# Patient Record
Sex: Female | Born: 1945 | Race: White | Hispanic: No | Smoking: Current every day smoker
Health system: Southern US, Community
[De-identification: ages and names within clinical notes are randomized; demographics above are authoritative.]

## PROBLEM LIST (undated history)

## (undated) DIAGNOSIS — J449 Chronic obstructive pulmonary disease, unspecified: Secondary | ICD-10-CM

## (undated) DIAGNOSIS — E785 Hyperlipidemia, unspecified: Secondary | ICD-10-CM

## (undated) DIAGNOSIS — N3090 Cystitis, unspecified without hematuria: Secondary | ICD-10-CM

## (undated) DIAGNOSIS — G8929 Other chronic pain: Secondary | ICD-10-CM

## (undated) DIAGNOSIS — C801 Malignant (primary) neoplasm, unspecified: Secondary | ICD-10-CM

## (undated) DIAGNOSIS — K219 Gastro-esophageal reflux disease without esophagitis: Secondary | ICD-10-CM

## (undated) DIAGNOSIS — E079 Disorder of thyroid, unspecified: Secondary | ICD-10-CM

## (undated) DIAGNOSIS — IMO0002 Reserved for concepts with insufficient information to code with codable children: Secondary | ICD-10-CM

## (undated) HISTORY — PX: EYE SURGERY: SHX253

---

## 2013-04-27 ENCOUNTER — Encounter (HOSPITAL_COMMUNITY): Payer: Self-pay | Admitting: Emergency Medicine

## 2013-04-27 ENCOUNTER — Emergency Department (HOSPITAL_COMMUNITY)
Admission: EM | Admit: 2013-04-27 | Discharge: 2013-04-27 | Disposition: A | Discharge status: Home / Self Care | Payer: Medicare Other | Attending: Emergency Medicine | Admitting: Emergency Medicine

## 2013-04-27 ENCOUNTER — Emergency Department (HOSPITAL_COMMUNITY): Payer: Medicare Other

## 2013-04-27 DIAGNOSIS — Z8501 Personal history of malignant neoplasm of esophagus: Secondary | ICD-10-CM | POA: Insufficient documentation

## 2013-04-27 DIAGNOSIS — Z7982 Long term (current) use of aspirin: Secondary | ICD-10-CM | POA: Insufficient documentation

## 2013-04-27 DIAGNOSIS — E079 Disorder of thyroid, unspecified: Secondary | ICD-10-CM | POA: Insufficient documentation

## 2013-04-27 DIAGNOSIS — Z872 Personal history of diseases of the skin and subcutaneous tissue: Secondary | ICD-10-CM | POA: Insufficient documentation

## 2013-04-27 DIAGNOSIS — M549 Dorsalgia, unspecified: Secondary | ICD-10-CM | POA: Insufficient documentation

## 2013-04-27 DIAGNOSIS — F17200 Nicotine dependence, unspecified, uncomplicated: Secondary | ICD-10-CM | POA: Insufficient documentation

## 2013-04-27 DIAGNOSIS — IMO0002 Reserved for concepts with insufficient information to code with codable children: Secondary | ICD-10-CM | POA: Insufficient documentation

## 2013-04-27 DIAGNOSIS — K219 Gastro-esophageal reflux disease without esophagitis: Secondary | ICD-10-CM | POA: Insufficient documentation

## 2013-04-27 DIAGNOSIS — J449 Chronic obstructive pulmonary disease, unspecified: Secondary | ICD-10-CM | POA: Insufficient documentation

## 2013-04-27 DIAGNOSIS — Z8742 Personal history of other diseases of the female genital tract: Secondary | ICD-10-CM | POA: Insufficient documentation

## 2013-04-27 DIAGNOSIS — E785 Hyperlipidemia, unspecified: Secondary | ICD-10-CM | POA: Insufficient documentation

## 2013-04-27 DIAGNOSIS — Z8739 Personal history of other diseases of the musculoskeletal system and connective tissue: Secondary | ICD-10-CM | POA: Insufficient documentation

## 2013-04-27 DIAGNOSIS — Z79899 Other long term (current) drug therapy: Secondary | ICD-10-CM | POA: Insufficient documentation

## 2013-04-27 DIAGNOSIS — G8929 Other chronic pain: Secondary | ICD-10-CM | POA: Insufficient documentation

## 2013-04-27 DIAGNOSIS — K5900 Constipation, unspecified: Secondary | ICD-10-CM | POA: Insufficient documentation

## 2013-04-27 DIAGNOSIS — R112 Nausea with vomiting, unspecified: Secondary | ICD-10-CM | POA: Insufficient documentation

## 2013-04-27 DIAGNOSIS — R109 Unspecified abdominal pain: Secondary | ICD-10-CM | POA: Insufficient documentation

## 2013-04-27 DIAGNOSIS — J4489 Other specified chronic obstructive pulmonary disease: Secondary | ICD-10-CM | POA: Insufficient documentation

## 2013-04-27 DIAGNOSIS — R3919 Other difficulties with micturition: Secondary | ICD-10-CM | POA: Insufficient documentation

## 2013-04-27 HISTORY — DX: Reserved for concepts with insufficient information to code with codable children: IMO0002

## 2013-04-27 HISTORY — DX: Disorder of thyroid, unspecified: E07.9

## 2013-04-27 HISTORY — DX: Cystitis, unspecified without hematuria: N30.90

## 2013-04-27 HISTORY — DX: Hyperlipidemia, unspecified: E78.5

## 2013-04-27 HISTORY — DX: Malignant (primary) neoplasm, unspecified: C80.1

## 2013-04-27 HISTORY — DX: Gastro-esophageal reflux disease without esophagitis: K21.9

## 2013-04-27 HISTORY — DX: Chronic obstructive pulmonary disease, unspecified: J44.9

## 2013-04-27 HISTORY — DX: Other chronic pain: G89.29

## 2013-04-27 LAB — CBC WITH DIFFERENTIAL/PLATELET
BASOS PCT: 1 % (ref 0–1)
Basophils Absolute: 0.1 10*3/uL (ref 0.0–0.1)
EOS ABS: 0.1 10*3/uL (ref 0.0–0.7)
EOS PCT: 1 % (ref 0–5)
HCT: 41.1 % (ref 36.0–46.0)
Hemoglobin: 14.1 g/dL (ref 12.0–15.0)
LYMPHS ABS: 1.5 10*3/uL (ref 0.7–4.0)
Lymphocytes Relative: 17 % (ref 12–46)
MCH: 33.5 pg (ref 26.0–34.0)
MCHC: 34.3 g/dL (ref 30.0–36.0)
MCV: 97.6 fL (ref 78.0–100.0)
Monocytes Absolute: 0.9 10*3/uL (ref 0.1–1.0)
Monocytes Relative: 10 % (ref 3–12)
NEUTROS PCT: 71 % (ref 43–77)
Neutro Abs: 6.1 10*3/uL (ref 1.7–7.7)
Platelets: 362 10*3/uL (ref 150–400)
RBC: 4.21 MIL/uL (ref 3.87–5.11)
RDW: 13.3 % (ref 11.5–15.5)
WBC: 8.7 10*3/uL (ref 4.0–10.5)

## 2013-04-27 LAB — URINALYSIS, ROUTINE W REFLEX MICROSCOPIC
Bilirubin Urine: NEGATIVE
Glucose, UA: NEGATIVE mg/dL
Hgb urine dipstick: NEGATIVE
Ketones, ur: 15 mg/dL — AB
LEUKOCYTES UA: NEGATIVE
NITRITE: NEGATIVE
PH: 7 (ref 5.0–8.0)
Protein, ur: NEGATIVE mg/dL
SPECIFIC GRAVITY, URINE: 1.018 (ref 1.005–1.030)
UROBILINOGEN UA: 1 mg/dL (ref 0.0–1.0)

## 2013-04-27 LAB — LACTIC ACID, PLASMA: LACTIC ACID, VENOUS: 0.9 mmol/L (ref 0.5–2.2)

## 2013-04-27 LAB — COMPREHENSIVE METABOLIC PANEL
ALBUMIN: 3.8 g/dL (ref 3.5–5.2)
ALT: 15 U/L (ref 0–35)
AST: 21 U/L (ref 0–37)
Alkaline Phosphatase: 150 U/L — ABNORMAL HIGH (ref 39–117)
BUN: 23 mg/dL (ref 6–23)
CALCIUM: 9.3 mg/dL (ref 8.4–10.5)
CO2: 25 mEq/L (ref 19–32)
Chloride: 99 mEq/L (ref 96–112)
Creatinine, Ser: 0.97 mg/dL (ref 0.50–1.10)
GFR calc Af Amer: 69 mL/min — ABNORMAL LOW (ref >90)
GFR calc non Af Amer: 59 mL/min — ABNORMAL LOW (ref >90)
GLUCOSE: 114 mg/dL — AB (ref 70–99)
Potassium: 4 mEq/L (ref 3.7–5.3)
SODIUM: 140 meq/L (ref 137–147)
TOTAL PROTEIN: 8.2 g/dL (ref 6.0–8.3)
Total Bilirubin: 0.3 mg/dL (ref 0.3–1.2)

## 2013-04-27 LAB — LIPASE, BLOOD: LIPASE: 19 U/L (ref 11–59)

## 2013-04-27 MED ORDER — IOHEXOL 300 MG/ML  SOLN
25.0000 mL | INTRAMUSCULAR | Status: AC
  Administered 2013-04-27: 25 mL via ORAL

## 2013-04-27 MED ORDER — IOHEXOL 300 MG/ML  SOLN
80.0000 mL | Freq: Once | INTRAMUSCULAR | Status: AC | PRN
Start: 2013-04-27 — End: 2013-04-27
  Administered 2013-04-27: 80 mL via INTRAVENOUS

## 2013-04-27 MED ORDER — HYDROMORPHONE HCL PF 1 MG/ML IJ SOLN
0.5000 mg | Freq: Once | INTRAMUSCULAR | Status: AC
  Administered 2013-04-27: 0.5 mg via INTRAVENOUS
  Filled 2013-04-27: qty 1

## 2013-04-27 MED ORDER — PEG 3350-KCL-NABCB-NACL-NASULF 236 G PO SOLR: 4000.0000 mL | Freq: Once | ORAL | Status: DC

## 2013-04-27 MED ORDER — MAGNESIUM CITRATE PO SOLN
1.0000 | Freq: Once | ORAL | Status: AC
Start: 2013-04-27 — End: 2013-04-27
  Administered 2013-04-27: 1 via ORAL
  Filled 2013-04-27: qty 296

## 2013-04-27 MED ORDER — MORPHINE SULFATE 4 MG/ML IJ SOLN
2.0000 mg | Freq: Once | INTRAMUSCULAR | Status: AC
  Administered 2013-04-27: 2 mg via INTRAVENOUS
  Filled 2013-04-27: qty 1

## 2013-04-27 MED ORDER — ONDANSETRON HCL 4 MG/2ML IJ SOLN
4.0000 mg | Freq: Once | INTRAMUSCULAR | Status: AC
  Administered 2013-04-27: 4 mg via INTRAVENOUS
  Filled 2013-04-27: qty 2

## 2013-04-27 NOTE — ED Notes (Signed)
Patient transported to CT 

## 2013-04-27 NOTE — ED Notes (Signed)
PA at bedside.

## 2013-04-27 NOTE — Discharge Instructions (Signed)
Abdominal Pain, Adult °Many things can cause abdominal pain. Usually, abdominal pain is not caused by a disease and will improve without treatment. It can often be observed and treated at home. Your health care provider will do a physical exam and possibly order blood tests and X-rays to help determine the seriousness of your pain. However, in many cases, more time must pass before a clear cause of the pain can be found. Before that point, your health care provider may not know if you need more testing or further treatment. °HOME CARE INSTRUCTIONS  °Monitor your abdominal pain for any changes. The following actions may help to alleviate any discomfort you are experiencing: °· Only take over-the-counter or prescription medicines as directed by your health care provider. °· Do not take laxatives unless directed to do so by your health care provider. °· Try a clear liquid diet (broth, tea, or water) as directed by your health care provider. Slowly move to a bland diet as tolerated. °SEEK MEDICAL CARE IF: °· You have unexplained abdominal pain. °· You have abdominal pain associated with nausea or diarrhea. °· You have pain when you urinate or have a bowel movement. °· You experience abdominal pain that wakes you in the night. °· You have abdominal pain that is worsened or improved by eating food. °· You have abdominal pain that is worsened with eating fatty foods. °SEEK IMMEDIATE MEDICAL CARE IF:  °· Your pain does not go away within 2 hours. °· You have a fever. °· You keep throwing up (vomiting). °· Your pain is felt only in portions of the abdomen, such as the right side or the left lower portion of the abdomen. °· You pass bloody or black tarry stools. °MAKE SURE YOU: °· Understand these instructions.   °· Will watch your condition.   °· Will get help right away if you are not doing well or get worse.   °Document Released: 11/19/2004 Document Revised: 11/30/2012 Document Reviewed: 10/19/2012 °[employer]® Patient  Information ©2014 [employer]. °Constipation, Adult °Constipation is when a person has fewer than 3 bowel movements a week; has difficulty having a bowel movement; or has stools that are dry, hard, or larger than normal. As people grow older, constipation is more common. If you try to fix constipation with medicines that make you have a bowel movement (laxatives), the problem may get worse. Long-term laxative use may cause the muscles of the colon to become weak. A low-fiber diet, not taking in enough fluids, and taking certain medicines may make constipation worse. °CAUSES  °· Certain medicines, such as antidepressants, pain medicine, iron supplements, antacids, and water pills.   °· Certain diseases, such as diabetes, irritable bowel syndrome (IBS), thyroid disease, or depression.   °· Not drinking enough water.   °· Not eating enough fiber-rich foods.   °· Stress or travel. °· Lack of physical activity or exercise. °· Not going to the restroom when there is the urge to have a bowel movement. °· Ignoring the urge to have a bowel movement. °· Using laxatives too much. °SYMPTOMS  °· Having fewer than 3 bowel movements a week.   °· Straining to have a bowel movement.   °· Having hard, dry, or larger than normal stools.   °· Feeling full or bloated.   °· Pain in the lower abdomen. °· Not feeling relief after having a bowel movement. °DIAGNOSIS  °Your caregiver will take a medical history and perform a physical exam. Further testing may be done for severe constipation. Some tests may include:  °· A barium enema   X-ray to examine your rectum, colon, and sometimes, your small intestine. °· A sigmoidoscopy to examine your lower colon. °· A colonoscopy to examine your entire colon. °TREATMENT  °Treatment will depend on the severity of your constipation and what is causing it. Some dietary treatments include drinking more fluids and eating more fiber-rich foods. Lifestyle treatments may include regular exercise. If these  diet and lifestyle recommendations do not help, your caregiver may recommend taking over-the-counter laxative medicines to help you have bowel movements. Prescription medicines may be prescribed if over-the-counter medicines do not work.  °HOME CARE INSTRUCTIONS  °· Increase dietary fiber in your diet, such as fruits, vegetables, whole grains, and beans. Limit high-fat and processed sugars in your diet, such as French fries, hamburgers, cookies, candies, and soda.   °· A fiber supplement may be added to your diet if you cannot get enough fiber from foods.   °· Drink enough fluids to keep your urine clear or pale yellow.   °· Exercise regularly or as directed by your caregiver.   °· Go to the restroom when you have the urge to go. Do not hold it. °· Only take medicines as directed by your caregiver. Do not take other medicines for constipation without talking to your caregiver first. °SEEK IMMEDIATE MEDICAL CARE IF:  °· You have bright red blood in your stool.   °· Your constipation lasts for more than 4 days or gets worse.   °· You have abdominal or rectal pain.   °· You have thin, pencil-like stools. °· You have unexplained weight loss. °MAKE SURE YOU:  °· Understand these instructions. °· Will watch your condition. °· Will get help right away if you are not doing well or get worse. °Document Released: 11/08/2003 Document Revised: 05/04/2011 Document Reviewed: 11/21/2012 °[employer]® Patient Information ©2014 [employer]. ° °

## 2013-04-27 NOTE — ED Notes (Signed)
CT notified pt is ready

## 2013-04-27 NOTE — ED Provider Notes (Signed)
CT scan pending to evaluate abdominal pain, r/o obstruction. Patient states rectal exam performed yesterday by her doctor did not show impaction. Repeat exam deferred.  CT without obstruction or significant abnormality. Reviewed with Dr. Roxanne Mins. Supports constipation as source of abdominal pain. Mag Citrate given in ED, Rx for GoLytely. Discharge home to follow up with PCP tomorrow for recheck.   Dewaine Oats, PA-C 04/27/13 2212

## 2013-04-27 NOTE — ED Notes (Signed)
Per pt sts 1 week of severe constipation snd now she is vomiting. Sts nothing OTC has worked. sts also severe back pain.

## 2013-04-27 NOTE — ED Provider Notes (Signed)
CSN: [id number]     Arrival date & time 04/27/13  1651 History   First MD Initiated Contact with Patient 04/27/13 1828     Chief Complaint  Patient presents with  . Abdominal Pain  . Constipation  . Emesis    HPI  Makayla Thompson is a 68 y.o. female with a PMH of COPD, DDD, GERD, esophageal cancer, ulcer, thyroid disease, cystitis, chronic pain, and HLD who presents to the ED for evaluation of abdominal pain, constipation, and emesis. History was provided by the patient and her sister. Patient has had constant abdominal pain for the past week. Her pain is located throughout her abdomen without radiation. She describes it as an aching pain with intermittent squeezing sensation. Nothing worsens or relieves her pain. She has also been constipated with no bowel movement in the past 8-9 days. Patient has tried Miralax and stool softeners with no relief. She did have some brown anal leakage today. No rectal bleeding/pain. She also has had nausea with emesis x 3 today. Mild nausea currently. No dysuria but urine has been decreased. No chest pain, SOB, dyspnea, fever, chills, headache, dizziness, lightheadedness, or leg edema. Previous abdominal surgeries include a repair for esophageal cancer. Went to her PCP yesterday who sent her to the ED for a CT scan.    Past Medical History  Diagnosis Date  . Cancer   . Ulcer   . GERD (gastroesophageal reflux disease)   . Thyroid disease   . Cystitis   . DDD (degenerative disc disease)   . COPD (chronic obstructive pulmonary disease)   . Chronic pain   . Hyperlipidemia    History reviewed. No pertinent past surgical history. History reviewed. No pertinent family history. History  Substance Use Topics  . Smoking status: Current Every Day Smoker  . Smokeless tobacco: Not on file  . Alcohol Use: No   OB History   Grav Para Term Preterm Abortions TAB SAB Ect Mult Living                 Review of Systems  Constitutional: Negative for fever, chills,  diaphoresis, activity change, appetite change and fatigue.  HENT: Negative for congestion, rhinorrhea and sore throat.   Respiratory: Negative for cough and shortness of breath.   Cardiovascular: Negative for chest pain.  Gastrointestinal: Positive for nausea, vomiting, abdominal pain and constipation. Negative for diarrhea, blood in stool, abdominal distention, anal bleeding and rectal pain.  Genitourinary: Positive for decreased urine volume. Negative for dysuria, hematuria, flank pain, vaginal bleeding, vaginal discharge, difficulty urinating and vaginal pain.  Musculoskeletal: Positive for back pain (chronic). Negative for myalgias and neck pain.  Neurological: Negative for dizziness, syncope, weakness, light-headedness and headaches.     Allergies  Amitriptyline; Codeine; Eggs or egg-derived products; Metoprolol; Sulfa antibiotics; and Verapamil  Home Medications   Current Outpatient Rx  Name  Route  Sig  Dispense  Refill  . aspirin 81 MG tablet   Oral   Take 81 mg by mouth daily.         . cyclobenzaprine (FLEXERIL) 10 MG tablet   Oral   Take 10 mg by mouth daily as needed for muscle spasms.         Marland Kitchen esomeprazole (NEXIUM) 40 MG capsule   Oral   Take 40 mg by mouth daily at 12 noon.         . fluticasone (FLONASE) 50 MCG/ACT nasal spray   Each Nare   Place 1 spray into both nostrils  daily.         . levothyroxine (SYNTHROID, LEVOTHROID) 100 MCG tablet   Oral   Take 100 mcg by mouth daily before breakfast.         . ondansetron (ZOFRAN) 8 MG tablet   Oral   Take 8 mg by mouth every 8 (eight) hours as needed for nausea or vomiting.         Marland Kitchen oxyCODONE-acetaminophen (PERCOCET/ROXICET) 5-325 MG per tablet   Oral   Take 1 tablet by mouth every 4 (four) hours as needed for severe pain.         . polyethylene glycol (MIRALAX / GLYCOLAX) packet   Oral   Take 17 g by mouth daily.         . pravastatin (PRAVACHOL) 40 MG tablet   Oral   Take 40 mg by  mouth daily.         Marland Kitchen venlafaxine XR (EFFEXOR-XR) 75 MG 24 hr capsule   Oral   Take 75 mg by mouth daily with breakfast.          BP 122/69  Pulse 107  Temp(Src) 98.3 F (36.8 C)  Resp 18  Wt 128 lb (58.06 kg)  SpO2 97%  Filed Vitals:   04/27/13 1838 04/27/13 1900 04/27/13 1919 04/27/13 1930  BP: 123/78 114/75 129/72 138/73  Pulse: 97 92 95 92  Temp: 98.3 F (36.8 C)     Resp:   20 18  Weight:      SpO2: 100% 98% 97% 100%    Physical Exam  Nursing note and vitals reviewed. Constitutional: She is oriented to person, place, and time. She appears well-developed and well-nourished. No distress.  HENT:  Head: Normocephalic and atraumatic.  Right Ear: External ear normal.  Left Ear: External ear normal.  Nose: Nose normal.  Mouth/Throat: Oropharynx is clear and moist.  Eyes: Conjunctivae are normal. Right eye exhibits no discharge. Left eye exhibits no discharge.  Neck: Normal range of motion. Neck supple.  Cardiovascular: Normal rate, regular rhythm, normal heart sounds and intact distal pulses.  Exam reveals no gallop and no friction rub.   No murmur heard. Pulmonary/Chest: Effort normal and breath sounds normal. No respiratory distress. She has no wheezes. She has no rales. She exhibits no tenderness.  Abdominal: Soft. Bowel sounds are normal. She exhibits no distension and no mass. There is tenderness. There is no rebound and no guarding.  Diffuse tenderness to palpation to the abdomen throughout  Musculoskeletal: Normal range of motion. She exhibits no edema and no tenderness.  No LE edema or calf tenderness bilaterally  Neurological: She is alert and oriented to person, place, and time.  Skin: Skin is warm and dry. She is not diaphoretic.    ED Course  Procedures (including critical care time) Labs Review Labs Reviewed  COMPREHENSIVE METABOLIC PANEL - Abnormal; Notable for the following:    Glucose, Bld 114 (*)    Alkaline Phosphatase 150 (*)    GFR calc  non Af Amer 59 (*)    GFR calc Af Amer 69 (*)    All other components within normal limits  CBC WITH DIFFERENTIAL  LIPASE, BLOOD  URINALYSIS, ROUTINE W REFLEX MICROSCOPIC   Imaging Review Dg Abd 1 View  04/27/2013   CLINICAL DATA:  Upper abdominal pain for 1 week.  EXAM: ABDOMEN - 1 VIEW  COMPARISON:  None.  FINDINGS: The bowel gas pattern is normal. No radio-opaque calculi or other significant radiographic abnormality are seen.  IMPRESSION: Negative.   Electronically Signed   By: Kerby Moors M.D.   On: 04/27/2013 17:50     EKG Interpretation None      Results for orders placed during the hospital encounter of 04/27/13  CBC WITH DIFFERENTIAL      Result Value Ref Range   WBC 8.7  4.0 - 10.5 K/uL   RBC 4.21  3.87 - 5.11 MIL/uL   Hemoglobin 14.1  12.0 - 15.0 g/dL   HCT 41.1  36.0 - 46.0 %   MCV 97.6  78.0 - 100.0 fL   MCH 33.5  26.0 - 34.0 pg   MCHC 34.3  30.0 - 36.0 g/dL   RDW 13.3  11.5 - 15.5 %   Platelets 362  150 - 400 K/uL   Neutrophils Relative % 71  43 - 77 %   Neutro Abs 6.1  1.7 - 7.7 K/uL   Lymphocytes Relative 17  12 - 46 %   Lymphs Abs 1.5  0.7 - 4.0 K/uL   Monocytes Relative 10  3 - 12 %   Monocytes Absolute 0.9  0.1 - 1.0 K/uL   Eosinophils Relative 1  0 - 5 %   Eosinophils Absolute 0.1  0.0 - 0.7 K/uL   Basophils Relative 1  0 - 1 %   Basophils Absolute 0.1  0.0 - 0.1 K/uL  COMPREHENSIVE METABOLIC PANEL      Result Value Ref Range   Sodium 140  137 - 147 mEq/L   Potassium 4.0  3.7 - 5.3 mEq/L   Chloride 99  96 - 112 mEq/L   CO2 25  19 - 32 mEq/L   Glucose, Bld 114 (*) 70 - 99 mg/dL   BUN 23  6 - 23 mg/dL   Creatinine, Ser 0.97  0.50 - 1.10 mg/dL   Calcium 9.3  8.4 - 10.5 mg/dL   Total Protein 8.2  6.0 - 8.3 g/dL   Albumin 3.8  3.5 - 5.2 g/dL   AST 21  0 - 37 U/L   ALT 15  0 - 35 U/L   Alkaline Phosphatase 150 (*) 39 - 117 U/L   Total Bilirubin 0.3  0.3 - 1.2 mg/dL   GFR calc non Af Amer 59 (*) >90 mL/min   GFR calc Af Amer 69 (*) >90 mL/min   LIPASE, BLOOD      Result Value Ref Range   Lipase 19  11 - 59 U/L  LACTIC ACID, PLASMA      Result Value Ref Range   Lactic Acid, Venous 0.9  0.5 - 2.2 mmol/L    DG Abd 1 View (Final result)  Result time: 04/27/13 17:50:34    Final result by Rad Results In Interface (04/27/13 17:50:34)    Narrative:   CLINICAL DATA: Upper abdominal pain for 1 week.  EXAM: ABDOMEN - 1 VIEW  COMPARISON: None.  FINDINGS: The bowel gas pattern is normal. No radio-opaque calculi or other significant radiographic abnormality are seen.  IMPRESSION: Negative.   Electronically Signed By: Kerby Moors M.D. On: 04/27/2013 17:50     MDM   Makayla Thompson is a 68 y.o. female with a PMH of COPD, DDD, GERD, esophageal cancer, ulcer, thyroid disease, cystitis, chronic pain, and HLD who presents to the ED for evaluation of abdominal pain, constipation, and emesis.   Rechecks  8:25 PM = Pain not improved after morphine. Ordering 0.5 mg dilaudid.     8:15 PM = Signed out care to [facility]  PA-C to await results of CT scan, EKG, UA, and perform rectal exam. Etiology of abdominal pain unclear. Possibly due to constipation vs obstruction. Lactic acid WNL. Labs unremarkable.    Mercy Moore PA-C   This patient was discussed with Dr. Doristine Counter, PA-C 04/27/13 2026

## 2013-05-03 NOTE — ED Provider Notes (Signed)
Medical screening examination/treatment/procedure(s) were performed by non-physician practitioner and as supervising physician I was immediately available for consultation/collaboration.   EKG Interpretation None        Orpah Greek, MD 05/03/13 1526

## 2013-05-03 NOTE — ED Provider Notes (Signed)
Medical screening examination/treatment/procedure(s) were performed by non-physician practitioner and as supervising physician I was immediately available for consultation/collaboration.   EKG Interpretation None        Agapita Savarino J. Cashay Manganelli, MD 05/03/13 1526 

## 2013-05-08 ENCOUNTER — Emergency Department (HOSPITAL_COMMUNITY): Payer: Medicare Other

## 2013-05-08 ENCOUNTER — Emergency Department (HOSPITAL_COMMUNITY)
Admission: EM | Admit: 2013-05-08 | Discharge: 2013-05-08 | Disposition: A | Discharge status: Home / Self Care | Payer: Medicare Other | Attending: Emergency Medicine | Admitting: Emergency Medicine

## 2013-05-08 ENCOUNTER — Encounter (HOSPITAL_COMMUNITY): Payer: Self-pay | Admitting: Emergency Medicine

## 2013-05-08 DIAGNOSIS — G8929 Other chronic pain: Secondary | ICD-10-CM | POA: Insufficient documentation

## 2013-05-08 DIAGNOSIS — E079 Disorder of thyroid, unspecified: Secondary | ICD-10-CM | POA: Insufficient documentation

## 2013-05-08 DIAGNOSIS — F17200 Nicotine dependence, unspecified, uncomplicated: Secondary | ICD-10-CM | POA: Insufficient documentation

## 2013-05-08 DIAGNOSIS — Z7982 Long term (current) use of aspirin: Secondary | ICD-10-CM | POA: Insufficient documentation

## 2013-05-08 DIAGNOSIS — Z79899 Other long term (current) drug therapy: Secondary | ICD-10-CM | POA: Insufficient documentation

## 2013-05-08 DIAGNOSIS — J449 Chronic obstructive pulmonary disease, unspecified: Secondary | ICD-10-CM | POA: Insufficient documentation

## 2013-05-08 DIAGNOSIS — Z872 Personal history of diseases of the skin and subcutaneous tissue: Secondary | ICD-10-CM | POA: Insufficient documentation

## 2013-05-08 DIAGNOSIS — Z8501 Personal history of malignant neoplasm of esophagus: Secondary | ICD-10-CM | POA: Insufficient documentation

## 2013-05-08 DIAGNOSIS — R109 Unspecified abdominal pain: Secondary | ICD-10-CM

## 2013-05-08 DIAGNOSIS — K219 Gastro-esophageal reflux disease without esophagitis: Secondary | ICD-10-CM | POA: Insufficient documentation

## 2013-05-08 DIAGNOSIS — IMO0002 Reserved for concepts with insufficient information to code with codable children: Secondary | ICD-10-CM | POA: Insufficient documentation

## 2013-05-08 DIAGNOSIS — J4489 Other specified chronic obstructive pulmonary disease: Secondary | ICD-10-CM | POA: Insufficient documentation

## 2013-05-08 DIAGNOSIS — E785 Hyperlipidemia, unspecified: Secondary | ICD-10-CM | POA: Insufficient documentation

## 2013-05-08 DIAGNOSIS — K5900 Constipation, unspecified: Secondary | ICD-10-CM | POA: Insufficient documentation

## 2013-05-08 LAB — URINALYSIS, ROUTINE W REFLEX MICROSCOPIC
Bilirubin Urine: NEGATIVE
Glucose, UA: NEGATIVE mg/dL
Hgb urine dipstick: NEGATIVE
KETONES UR: 15 mg/dL — AB
LEUKOCYTES UA: NEGATIVE
NITRITE: NEGATIVE
PH: 7.5 (ref 5.0–8.0)
Protein, ur: NEGATIVE mg/dL
Specific Gravity, Urine: 1.014 (ref 1.005–1.030)
Urobilinogen, UA: 1 mg/dL (ref 0.0–1.0)

## 2013-05-08 LAB — CBC WITH DIFFERENTIAL/PLATELET
BASOS ABS: 0.1 10*3/uL (ref 0.0–0.1)
Basophils Relative: 1 % (ref 0–1)
EOS PCT: 2 % (ref 0–5)
Eosinophils Absolute: 0.1 10*3/uL (ref 0.0–0.7)
HCT: 38 % (ref 36.0–46.0)
Hemoglobin: 12.7 g/dL (ref 12.0–15.0)
Lymphocytes Relative: 18 % (ref 12–46)
Lymphs Abs: 1.5 10*3/uL (ref 0.7–4.0)
MCH: 32.2 pg (ref 26.0–34.0)
MCHC: 33.4 g/dL (ref 30.0–36.0)
MCV: 96.2 fL (ref 78.0–100.0)
Monocytes Absolute: 0.8 10*3/uL (ref 0.1–1.0)
Monocytes Relative: 9 % (ref 3–12)
NEUTROS ABS: 6.1 10*3/uL (ref 1.7–7.7)
NEUTROS PCT: 71 % (ref 43–77)
Platelets: 319 10*3/uL (ref 150–400)
RBC: 3.95 MIL/uL (ref 3.87–5.11)
RDW: 13.4 % (ref 11.5–15.5)
WBC: 8.6 10*3/uL (ref 4.0–10.5)

## 2013-05-08 LAB — COMPREHENSIVE METABOLIC PANEL
ALK PHOS: 133 U/L — AB (ref 39–117)
ALT: 10 U/L (ref 0–35)
AST: 16 U/L (ref 0–37)
Albumin: 3.5 g/dL (ref 3.5–5.2)
BUN: 12 mg/dL (ref 6–23)
CO2: 23 mEq/L (ref 19–32)
Calcium: 8.9 mg/dL (ref 8.4–10.5)
Chloride: 100 mEq/L (ref 96–112)
Creatinine, Ser: 0.94 mg/dL (ref 0.50–1.10)
GFR calc non Af Amer: 61 mL/min — ABNORMAL LOW (ref >90)
GFR, EST AFRICAN AMERICAN: 71 mL/min — AB (ref >90)
GLUCOSE: 80 mg/dL (ref 70–99)
POTASSIUM: 3.8 meq/L (ref 3.7–5.3)
Sodium: 139 mEq/L (ref 137–147)
Total Bilirubin: 0.4 mg/dL (ref 0.3–1.2)
Total Protein: 7.3 g/dL (ref 6.0–8.3)

## 2013-05-08 LAB — I-STAT CG4 LACTIC ACID, ED: Lactic Acid, Venous: 0.95 mmol/L (ref 0.5–2.2)

## 2013-05-08 LAB — LIPASE, BLOOD: LIPASE: 17 U/L (ref 11–59)

## 2013-05-08 MED ORDER — SODIUM CHLORIDE 0.9 % IV BOLUS (SEPSIS)
1000.0000 mL | Freq: Once | INTRAVENOUS | Status: AC
  Administered 2013-05-08: 1000 mL via INTRAVENOUS

## 2013-05-08 MED ORDER — ONDANSETRON HCL 4 MG/2ML IJ SOLN
4.0000 mg | Freq: Once | INTRAMUSCULAR | Status: AC
  Administered 2013-05-08: 4 mg via INTRAVENOUS
  Filled 2013-05-08: qty 2

## 2013-05-08 MED ORDER — HYDROMORPHONE HCL PF 1 MG/ML IJ SOLN
0.5000 mg | Freq: Once | INTRAMUSCULAR | Status: AC
  Administered 2013-05-08: 0.5 mg via INTRAVENOUS
  Filled 2013-05-08: qty 1

## 2013-05-08 MED ORDER — MILK AND MOLASSES ENEMA
1.0000 | Freq: Once | RECTAL | Status: AC
  Administered 2013-05-08: 250 mL via RECTAL
  Filled 2013-05-08: qty 250

## 2013-05-08 MED ORDER — MORPHINE SULFATE 4 MG/ML IJ SOLN
4.0000 mg | Freq: Once | INTRAMUSCULAR | Status: AC
  Administered 2013-05-08: 4 mg via INTRAVENOUS
  Filled 2013-05-08: qty 1

## 2013-05-08 MED ORDER — MAGNESIUM CITRATE PO SOLN
1.0000 | Freq: Once | ORAL | Status: AC
  Administered 2013-05-08: 1 via ORAL
  Filled 2013-05-08: qty 296

## 2013-05-08 MED ORDER — PROMETHAZINE HCL 25 MG RE SUPP: 25.0000 mg | Freq: Four times a day (QID) | RECTAL | Status: DC | PRN

## 2013-05-08 NOTE — ED Provider Notes (Signed)
Patient signed out to me at shift change. Patient with abdominal pain for multiple weeks. Last bowel movement was one week ago. Unable to a bowel movement at home. CT obtained from 2 days ago showed constipation. Patient since then has been to the [facility] ER and now back here for persistent pain, nausea, vomiting. Patient was initially evaluated by PA Jerline Pain and Dr. Dina Rich. Patient signed out to me after she received enema and magnesium saturate, awaiting bowel movement. Patient now has had a very small bowel movement, she continues to have right lower quadrant pain, nausea. I have discussed again with patient that her lab work and x-ray today were unremarkable. Patient has an appointment with gastroenterologist tomorrow. I have strongly instructed her to followup with them, this time no indication for admission. Patient her family voiced understanding. She'll be discharged home with followup tomorrow.  Renold Genta, PA-C 05/08/13 2028

## 2013-05-08 NOTE — Discharge Instructions (Signed)
Continue phenergan and zofran for pain. Please follow up with gastroenterologist tomorrow.     Abdominal Pain, Adult Many things can cause abdominal pain. Usually, abdominal pain is not caused by a disease and will improve without treatment. It can often be observed and treated at home. Your health care provider will do a physical exam and possibly order blood tests and X-rays to help determine the seriousness of your pain. However, in many cases, more time must pass before a clear cause of the pain can be found. Before that point, your health care provider may not know if you need more testing or further treatment. HOME CARE INSTRUCTIONS  Monitor your abdominal pain for any changes. The following actions may help to alleviate any discomfort you are experiencing:  Only take over-the-counter or prescription medicines as directed by your health care provider.  Do not take laxatives unless directed to do so by your health care provider.  Try a clear liquid diet (broth, tea, or water) as directed by your health care provider. Slowly move to a bland diet as tolerated. SEEK MEDICAL CARE IF:  You have unexplained abdominal pain.  You have abdominal pain associated with nausea or diarrhea.  You have pain when you urinate or have a bowel movement.  You experience abdominal pain that wakes you in the night.  You have abdominal pain that is worsened or improved by eating food.  You have abdominal pain that is worsened with eating fatty foods. SEEK IMMEDIATE MEDICAL CARE IF:   Your pain does not go away within 2 hours.  You have a fever.  You keep throwing up (vomiting).  Your pain is felt only in portions of the abdomen, such as the right side or the left lower portion of the abdomen.  You pass bloody or black tarry stools. MAKE SURE YOU:  Understand these instructions.   Will watch your condition.   Will get help right away if you are not doing well or get worse.  Document  Released: 11/19/2004 Document Revised: 11/30/2012 Document Reviewed: 10/19/2012 [facility] Patient Information 2014 [place].

## 2013-05-08 NOTE — ED Notes (Signed)
Pt sent from PCP for eval and admission; pt has hx of esophageal cancer and severe constipation and abdominal pain. LBM was over 1 week ago after enema in the ED. Pt sts she had one small BM on Saturday. Pt also reports constant nausea and vomiting, pain  is 10/10

## 2013-05-08 NOTE — ED Provider Notes (Signed)
CSN: [id number]     Arrival date & time 05/08/13  1330 History   First MD Initiated Contact with Patient 05/08/13 1410     Chief Complaint  Patient presents with  . Abdominal Pain     (Consider location/radiation/quality/duration/timing/severity/associated sxs/prior Treatment) HPI Comments: Makayla Thompson is a 68 y.o. female with a past medical history of esophageal cancer, COPD, chronic back pain, DDD, GERD presenting the Emergency Department with a chief complaint of abdominal pain and constipation.  The patient describes the discomfort as epigastric and generalized abdominal pain.  She report intermittent, cramping pain.  The patient reports several ED visits over the past two weeks for similar complaints.  She reports last BM was 2 days ago, and the last BM prior was with Soap suds enema at [facility].  She reports associated vomiting 2 days ago. She was seen by her PCP and was told to go to the ED for further management of her abdominal pain and constipation.   She reports taking miralax without resolution of symptoms.  The patient's sister reports GI appointment tomorrow. PCP: Dr. Myrtie Soman     Patient is a 68 y.o. female presenting with abdominal pain. The history is provided by the patient, medical records and a relative. No language interpreter was used.  Abdominal Pain Associated symptoms: constipation, nausea and vomiting   Associated symptoms: no chills, no diarrhea, no dysuria, no fever and no hematuria     Past Medical History  Diagnosis Date  . Cancer   . Ulcer   . GERD (gastroesophageal reflux disease)   . Thyroid disease   . Cystitis   . DDD (degenerative disc disease)   . COPD (chronic obstructive pulmonary disease)   . Chronic pain   . Hyperlipidemia    History reviewed. No pertinent past surgical history. No family history on file. History  Substance Use Topics  . Smoking status: Current Every Day Smoker  . Smokeless tobacco: Not on file  . Alcohol Use: No    OB History   Grav Para Term Preterm Abortions TAB SAB Ect Mult Living                 Review of Systems  Constitutional: Negative for fever and chills.  Gastrointestinal: Positive for nausea, vomiting, abdominal pain and constipation. Negative for diarrhea, blood in stool and abdominal distention.  Genitourinary: Negative for dysuria, urgency, hematuria, flank pain and difficulty urinating.      Allergies  Amitriptyline; Codeine; Eggs or egg-derived products; Metoprolol; Sulfa antibiotics; and Verapamil  Home Medications   Current Outpatient Rx  Name  Route  Sig  Dispense  Refill  . aspirin 81 MG tablet   Oral   Take 81 mg by mouth daily.         . cyclobenzaprine (FLEXERIL) 10 MG tablet   Oral   Take 10 mg by mouth daily as needed for muscle spasms.         Marland Kitchen esomeprazole (NEXIUM) 40 MG capsule   Oral   Take 40 mg by mouth 2 (two) times daily before a meal.          . fluticasone (FLONASE) 50 MCG/ACT nasal spray   Each Nare   Place 2 sprays into both nostrils daily.          Marland Kitchen ibuprofen (ADVIL,MOTRIN) 200 MG tablet   Oral   Take 400 mg by mouth every 6 (six) hours as needed for moderate pain.         Marland Kitchen  levothyroxine (SYNTHROID, LEVOTHROID) 100 MCG tablet   Oral   Take 100 mcg by mouth daily before breakfast.         . lidocaine (LIDODERM) 5 %   Transdermal   Place 1 patch onto the skin daily as needed (Back Pain). Remove & Discard patch within 12 hours or as directed by MD         . ondansetron (ZOFRAN) 8 MG tablet   Oral   Take 8 mg by mouth every 8 (eight) hours as needed for nausea or vomiting.         Marland Kitchen oxyCODONE-acetaminophen (PERCOCET/ROXICET) 5-325 MG per tablet   Oral   Take 1 tablet by mouth 3 (three) times daily as needed for moderate pain or severe pain.          . polyethylene glycol (MIRALAX / GLYCOLAX) packet   Oral   Take 17 g by mouth daily as needed.          . pravastatin (PRAVACHOL) 40 MG tablet   Oral   Take 40  mg by mouth daily.         . promethazine (PHENERGAN) 25 MG tablet   Oral   Take 25 mg by mouth every 6 (six) hours as needed for nausea or vomiting.         . venlafaxine XR (EFFEXOR-XR) 75 MG 24 hr capsule   Oral   Take 75 mg by mouth daily with breakfast.         . VITAMIN D, CHOLECALCIFEROL, PO   Oral   Take 1 tablet by mouth daily.          BP 135/73  Pulse 93  Temp(Src) 98 F (36.7 C) (Oral)  Resp 22  SpO2 100% Physical Exam  Nursing note and vitals reviewed. Constitutional: She is oriented to person, place, and time. She appears well-developed and well-nourished. No distress.  HENT:  Head: Normocephalic and atraumatic.  Eyes: EOM are normal. Pupils are equal, round, and reactive to light. No scleral icterus.  Neck: Neck supple.  Cardiovascular: Normal rate, regular rhythm and normal heart sounds.   No murmur heard. Pulmonary/Chest: Effort normal and breath sounds normal. She has no decreased breath sounds. She has no wheezes. She has no rhonchi. She has no rales.  Abdominal: Soft. Bowel sounds are normal. There is generalized tenderness. There is no rebound, no guarding, no CVA tenderness and negative Murphy's sign.  Epigastric tenderness to palpation, mild generalized abdominal tenderness.  Multiple well healing scars to abdomen.  Musculoskeletal: Normal range of motion. She exhibits no edema.  Neurological: She is alert and oriented to person, place, and time.  Skin: Skin is warm and dry. No rash noted.  Psychiatric: She has a normal mood and affect.    ED Course  Procedures (including critical care time) Labs Review Labs Reviewed  COMPREHENSIVE METABOLIC PANEL - Abnormal; Notable for the following:    Alkaline Phosphatase 133 (*)    GFR calc non Af Amer 61 (*)    GFR calc Af Amer 71 (*)    All other components within normal limits  URINALYSIS, ROUTINE W REFLEX MICROSCOPIC - Abnormal; Notable for the following:    APPearance CLOUDY (*)    Ketones, ur  15 (*)    All other components within normal limits  CBC WITH DIFFERENTIAL  LIPASE, BLOOD  I-STAT CG4 LACTIC ACID, ED   Imaging Review Dg Abd Acute W/chest  05/08/2013   CLINICAL DATA:  Constipation, abdominal pain,  vomiting  EXAM: ACUTE ABDOMEN SERIES (ABDOMEN 2 VIEW & CHEST 1 VIEW)  COMPARISON:  CT ABD/PELVIS W CM dated 04/27/2013; DG ABD 1 VIEW dated 04/27/2013  FINDINGS: Heart size is mildly prominent. No focal pulmonary parenchymal opacification. Mild hyperinflation may suggest emphysema. No pleural effusion. Clips overlie the mediastinum. No free air beneath the diaphragms.  Mild leftward thoracic curvature centered at T11. Normal bowel gas pattern. Left lower quadrant clips are noted. There is a linear 6 mm calcification projecting to the left of the L1-L2 interspace on the supine projection. This overlies the left L2 transverse process on upright imaging. Costochondral calcifications are noted elsewhere. No gaseous is bowel dilatation or air-fluid level is identified.  IMPRESSION: 6 mm calcification over the left mid abdomen which could represent a ureteral calcification although a review of the dissimilar prior exam 04/27/2013 demonstrates vascular calcifications over this approximate anatomic area that could appear similar. Because no radiopaque renal or ureteral calculus was seen on the prior exam, allowing for the presence of left renal hilar vascular calcifications, this calcification is felt most likely be vascular in nature and there is no other evidence for acute intra-abdominal or pelvic pathology.  Nonobstructive bowel gas pattern.   Electronically Signed   By: Conchita Paris M.D.   On: 05/08/2013 16:43     EKG Interpretation   Date/Time:  Monday May 08 2013 16:35:52 EDT Ventricular Rate:  90 PR Interval:  153 QRS Duration: 93 QT Interval:  400 QTC Calculation: 489 R Axis:   30 Text Interpretation:  Sinus rhythm RSR' in V1 or V2, probably normal  variant Borderline prolonged QT  interval no prior for comparison Confirmed  by HORTON  MD, COURTNEY ([id number]) on 05/08/2013 4:42:22 PM      MDM   Final diagnoses:  Constipation  Abdominal pain   Pt with a history of constipation.  Recently seen in this ED 04/27/2013 CT shows non-obstructing constipation, Seen at [facility] 05/01/2013 for similar complaints and given soap suds enema and lactulose. PE shows moderate epigastric tenderness, and mild generalized tenderness.  XR to rule out obstructive gas pattern. CBC WNL, CMP shows elevation of Alk Phos, decreased from 1 week ago.  UA without infection, well hydrated. Re-eval: pt reports minimal improvement of abdominal discomfort with morphine.  Reports a single episode of chest discomfort lasting seconds upon injection of morphine. EKG ordered. XR shows non-obstruction pattern. Discussed labs and XR results with Dr. Dina Rich, she suggests enema and milk of mag ordered. 1657 Pt-care assumed by Jeannett Senior, PA-C.  Meds given in ED:  Medications  milk and molasses enema (not administered)  magnesium citrate solution 1 Bottle (not administered)  sodium chloride 0.9 % bolus 1,000 mL (1,000 mLs Intravenous New Bag/Given 05/08/13 1540)  ondansetron (ZOFRAN) injection 4 mg (4 mg Intravenous Given 05/08/13 1545)  morphine 4 MG/ML injection 4 mg (4 mg Intravenous Given 05/08/13 1545)  HYDROmorphone (DILAUDID) injection 0.5 mg (0.5 mg Intravenous Given 05/08/13 1634)    New Prescriptions   No medications on file        Lorrine Kin, PA-C 05/09/13 2300

## 2013-05-08 NOTE — Progress Notes (Signed)
   CARE MANAGEMENT ED NOTE 05/08/2013  Patient:  Makayla Thompson, Makayla Thompson   Account Number:  [id number]  Date Initiated:  05/08/2013  Documentation initiated by:  Jackelyn Poling  Subjective/Objective Assessment:   68  yr old female [place] no pcp listed in EPIC     Subjective/Objective Assessment Detail:   pcp is a [facility] Dr Myrtie Soman     Action/Plan:   spoke with pt EPIC updated   Action/Plan Detail:   Anticipated DC Date:       Status Recommendation to Physician:   Result of Recommendation:    Other ED Services  Consult Working [place]  Other  Outpatient Services - Pt will follow up  PCP issues  PCP issues    Choice offered to / List presented to:            Status of service:  Completed, signed off  ED Comments:   ED Comments Detail:

## 2013-05-10 NOTE — ED Provider Notes (Signed)
Medical screening examination/treatment/procedure(s) were conducted as a shared visit with non-physician practitioner(s) and myself.  I personally evaluated the patient during the encounter.   EKG Interpretation   Date/Time:  Monday May 08 2013 16:35:52 EDT Ventricular Rate:  90 PR Interval:  153 QRS Duration: 93 QT Interval:  400 QTC Calculation: 489 R Axis:   30 Text Interpretation:  Sinus rhythm RSR' in V1 or V2, probably normal  variant Borderline prolonged QT interval no prior for comparison Confirmed  by HORTON  MD, COURTNEY ([id number]) on 05/08/2013 4:42:22 PM      Patient presents with abdominal pain. Patient has history of chronic abdominal pain. Recent CT scan with evidence of constipation. Patient has seen here and admitted at [facility] [place] for similar complaints. No bowel movement since Thursday. She is nontoxic on exam. Abdomen is soft without signs of peritonitis.  Workup initiated including plain films of the abdomen to evaluate for air fluid levels. Anticipate patient may continue to be constipated and need enema for bowel movement. Disposition pending.  Merryl Hacker, MD 05/10/13 [phone]

## 2013-05-14 ENCOUNTER — Emergency Department (HOSPITAL_COMMUNITY)
Admission: EM | Admit: 2013-05-14 | Discharge: 2013-05-14 | Disposition: A | Discharge status: Home / Self Care | Payer: Medicare Other | Attending: Emergency Medicine | Admitting: Emergency Medicine

## 2013-05-14 ENCOUNTER — Emergency Department (HOSPITAL_COMMUNITY): Payer: Medicare Other

## 2013-05-14 ENCOUNTER — Encounter (HOSPITAL_COMMUNITY): Payer: Self-pay | Admitting: Emergency Medicine

## 2013-05-14 DIAGNOSIS — K219 Gastro-esophageal reflux disease without esophagitis: Secondary | ICD-10-CM | POA: Insufficient documentation

## 2013-05-14 DIAGNOSIS — Z8501 Personal history of malignant neoplasm of esophagus: Secondary | ICD-10-CM | POA: Insufficient documentation

## 2013-05-14 DIAGNOSIS — R1013 Epigastric pain: Secondary | ICD-10-CM | POA: Insufficient documentation

## 2013-05-14 DIAGNOSIS — Z872 Personal history of diseases of the skin and subcutaneous tissue: Secondary | ICD-10-CM | POA: Insufficient documentation

## 2013-05-14 DIAGNOSIS — Z9889 Other specified postprocedural states: Secondary | ICD-10-CM | POA: Insufficient documentation

## 2013-05-14 DIAGNOSIS — Z7982 Long term (current) use of aspirin: Secondary | ICD-10-CM | POA: Insufficient documentation

## 2013-05-14 DIAGNOSIS — R109 Unspecified abdominal pain: Secondary | ICD-10-CM

## 2013-05-14 DIAGNOSIS — E785 Hyperlipidemia, unspecified: Secondary | ICD-10-CM | POA: Insufficient documentation

## 2013-05-14 DIAGNOSIS — J449 Chronic obstructive pulmonary disease, unspecified: Secondary | ICD-10-CM | POA: Insufficient documentation

## 2013-05-14 DIAGNOSIS — R1011 Right upper quadrant pain: Secondary | ICD-10-CM | POA: Insufficient documentation

## 2013-05-14 DIAGNOSIS — R079 Chest pain, unspecified: Secondary | ICD-10-CM

## 2013-05-14 DIAGNOSIS — F17200 Nicotine dependence, unspecified, uncomplicated: Secondary | ICD-10-CM | POA: Insufficient documentation

## 2013-05-14 DIAGNOSIS — R1084 Generalized abdominal pain: Secondary | ICD-10-CM | POA: Insufficient documentation

## 2013-05-14 DIAGNOSIS — E079 Disorder of thyroid, unspecified: Secondary | ICD-10-CM | POA: Insufficient documentation

## 2013-05-14 DIAGNOSIS — R112 Nausea with vomiting, unspecified: Secondary | ICD-10-CM | POA: Insufficient documentation

## 2013-05-14 DIAGNOSIS — Z79899 Other long term (current) drug therapy: Secondary | ICD-10-CM | POA: Insufficient documentation

## 2013-05-14 DIAGNOSIS — IMO0002 Reserved for concepts with insufficient information to code with codable children: Secondary | ICD-10-CM | POA: Insufficient documentation

## 2013-05-14 DIAGNOSIS — G8929 Other chronic pain: Secondary | ICD-10-CM | POA: Insufficient documentation

## 2013-05-14 DIAGNOSIS — Z87448 Personal history of other diseases of urinary system: Secondary | ICD-10-CM | POA: Insufficient documentation

## 2013-05-14 DIAGNOSIS — J4489 Other specified chronic obstructive pulmonary disease: Secondary | ICD-10-CM | POA: Insufficient documentation

## 2013-05-14 LAB — URINALYSIS, ROUTINE W REFLEX MICROSCOPIC
Bilirubin Urine: NEGATIVE
GLUCOSE, UA: NEGATIVE mg/dL
HGB URINE DIPSTICK: NEGATIVE
Ketones, ur: NEGATIVE mg/dL
Nitrite: NEGATIVE
Protein, ur: NEGATIVE mg/dL
Specific Gravity, Urine: 1.014 (ref 1.005–1.030)
UROBILINOGEN UA: 0.2 mg/dL (ref 0.0–1.0)
pH: 8.5 — ABNORMAL HIGH (ref 5.0–8.0)

## 2013-05-14 LAB — COMPREHENSIVE METABOLIC PANEL
ALT: 34 U/L (ref 0–35)
AST: 53 U/L — AB (ref 0–37)
Albumin: 3.3 g/dL — ABNORMAL LOW (ref 3.5–5.2)
Alkaline Phosphatase: 144 U/L — ABNORMAL HIGH (ref 39–117)
BUN: 13 mg/dL (ref 6–23)
CO2: 25 mEq/L (ref 19–32)
Calcium: 8.9 mg/dL (ref 8.4–10.5)
Chloride: 101 mEq/L (ref 96–112)
Creatinine, Ser: 0.97 mg/dL (ref 0.50–1.10)
GFR calc Af Amer: 69 mL/min — ABNORMAL LOW (ref >90)
GFR calc non Af Amer: 59 mL/min — ABNORMAL LOW (ref >90)
Glucose, Bld: 83 mg/dL (ref 70–99)
Potassium: 3.8 mEq/L (ref 3.7–5.3)
SODIUM: 141 meq/L (ref 137–147)
TOTAL PROTEIN: 7.4 g/dL (ref 6.0–8.3)
Total Bilirubin: 0.4 mg/dL (ref 0.3–1.2)

## 2013-05-14 LAB — I-STAT TROPONIN, ED: Troponin i, poc: 0 ng/mL (ref 0.00–0.08)

## 2013-05-14 LAB — CBC WITH DIFFERENTIAL/PLATELET
BASOS ABS: 0.1 10*3/uL (ref 0.0–0.1)
Basophils Relative: 1 % (ref 0–1)
EOS ABS: 0.1 10*3/uL (ref 0.0–0.7)
EOS PCT: 2 % (ref 0–5)
HCT: 37.8 % (ref 36.0–46.0)
Hemoglobin: 12.5 g/dL (ref 12.0–15.0)
Lymphocytes Relative: 14 % (ref 12–46)
Lymphs Abs: 1.3 10*3/uL (ref 0.7–4.0)
MCH: 32.5 pg (ref 26.0–34.0)
MCHC: 33.1 g/dL (ref 30.0–36.0)
MCV: 98.2 fL (ref 78.0–100.0)
Monocytes Absolute: 1.1 10*3/uL — ABNORMAL HIGH (ref 0.1–1.0)
Monocytes Relative: 12 % (ref 3–12)
NEUTROS PCT: 71 % (ref 43–77)
Neutro Abs: 6.5 10*3/uL (ref 1.7–7.7)
PLATELETS: 363 10*3/uL (ref 150–400)
RBC: 3.85 MIL/uL — ABNORMAL LOW (ref 3.87–5.11)
RDW: 13.6 % (ref 11.5–15.5)
WBC: 9 10*3/uL (ref 4.0–10.5)

## 2013-05-14 LAB — URINE MICROSCOPIC-ADD ON

## 2013-05-14 LAB — LIPASE, BLOOD: Lipase: 23 U/L (ref 11–59)

## 2013-05-14 MED ORDER — PROMETHAZINE HCL 25 MG RE SUPP: 25.0000 mg | Freq: Four times a day (QID) | RECTAL | Status: DC | PRN

## 2013-05-14 MED ORDER — ONDANSETRON HCL 4 MG PO TABS: 4.0000 mg | ORAL_TABLET | Freq: Four times a day (QID) | ORAL | Status: DC

## 2013-05-14 MED ORDER — MORPHINE SULFATE 4 MG/ML IJ SOLN
4.0000 mg | Freq: Once | INTRAMUSCULAR | Status: AC
  Administered 2013-05-14: 4 mg via INTRAVENOUS
  Filled 2013-05-14: qty 1

## 2013-05-14 MED ORDER — ONDANSETRON 4 MG PO TBDP
8.0000 mg | ORAL_TABLET | Freq: Once | ORAL | Status: AC
  Administered 2013-05-14: 8 mg via ORAL
  Filled 2013-05-14: qty 2

## 2013-05-14 MED ORDER — TRAMADOL HCL 50 MG PO TABS
50.0000 mg | ORAL_TABLET | Freq: Once | ORAL | Status: AC
  Administered 2013-05-14: 50 mg via ORAL
  Filled 2013-05-14: qty 1

## 2013-05-14 MED ORDER — SODIUM CHLORIDE 0.9 % IV SOLN
Freq: Once | INTRAVENOUS | Status: AC
  Administered 2013-05-14: 16:00:00 via INTRAVENOUS

## 2013-05-14 MED ORDER — TRAMADOL HCL 50 MG PO TABS
50.0000 mg | ORAL_TABLET | Freq: Four times a day (QID) | ORAL | Status: DC | PRN
Start: 2013-05-14 — End: 2013-06-03

## 2013-05-14 MED ORDER — ONDANSETRON HCL 4 MG/2ML IJ SOLN
4.0000 mg | Freq: Once | INTRAMUSCULAR | Status: AC
  Administered 2013-05-14: 4 mg via INTRAVENOUS
  Filled 2013-05-14: qty 2

## 2013-05-14 MED ORDER — TRAMADOL HCL 50 MG PO TABS: 50.0000 mg | ORAL_TABLET | Freq: Four times a day (QID) | ORAL | Status: DC | PRN

## 2013-05-14 NOTE — ED Provider Notes (Signed)
CSN: [id number]     Arrival date & time 05/14/13  1341 History   First MD Initiated Contact with Patient 05/14/13 1432     Chief Complaint  Patient presents with  . Abdominal Pain  . Emesis     (Consider location/radiation/quality/duration/timing/severity/associated sxs/prior Treatment) HPI Comments: Makayla Thompson is a 68 y.o. female with a past medical history of esophageal cancer a history of esophageal cancer (s/p [place] esophagogastrectomy (2011) and recurrent GOO requiring multiple dilations, most recent 2013), COPD, chronic back pain, DDD, GERD presenting the Emergency Department with a chief complaint of abdominal pain.  The patient describes the discomfort as epigastric and generalized abdominal pain.  She report intermittent, cramping pain.  The patient reports several ED visits over the past one month for similar complaints. She reports she was evaluated by the GI specialist as an out-pt and was told to discontinue her narcotics and take milk of magnesia BID for 5 days. She reports compliance and has had multiple BM, last BM yesterday.  She reports yesterday morning yellow and liquid for 2 days.  Denies blood or pus.  She reports ongoing nausea with emesis with solid food. Denies fever. She also complains of 2 episodes of non-exertional chest discomfort lasting 30 minutes, one episode yesterday and one episode today.  She reports they self resolved. She reports similar episodes in the past year, and has been evaluated for them by previous providers, "but they never amount to nothing". Denies history of MI. Reports early MI in mother. Denies stress test.   PCP: Dr. Myrtie Soman   Patient is a 68 y.o. female presenting with abdominal pain and vomiting. The history is provided by the patient and medical records. No language interpreter was used.  Abdominal Pain Associated symptoms: chest pain, nausea and vomiting   Associated symptoms: no chills, no constipation, no cough, no dysuria, no  fever, no hematuria and no shortness of breath   Emesis Associated symptoms: abdominal pain   Associated symptoms: no chills     Past Medical History  Diagnosis Date  . Cancer   . Ulcer   . GERD (gastroesophageal reflux disease)   . Thyroid disease   . Cystitis   . DDD (degenerative disc disease)   . COPD (chronic obstructive pulmonary disease)   . Chronic pain   . Hyperlipidemia    History reviewed. No pertinent past surgical history. No family history on file. History  Substance Use Topics  . Smoking status: Current Every Day Smoker    Types: Cigarettes  . Smokeless tobacco: Not on file  . Alcohol Use: No   OB History   Grav Para Term Preterm Abortions TAB SAB Ect Mult Living                 Review of Systems  Constitutional: Negative for fever and chills.  Respiratory: Negative for cough and shortness of breath.   Cardiovascular: Positive for chest pain. Negative for palpitations and leg swelling.  Gastrointestinal: Positive for nausea, vomiting and abdominal pain. Negative for constipation, blood in stool, abdominal distention and anal bleeding.  Genitourinary: Negative for dysuria and hematuria.      Allergies  Amitriptyline; Codeine; Eggs or egg-derived products; Metoprolol; Sulfa antibiotics; and Verapamil  Home Medications   Current Outpatient Rx  Name  Route  Sig  Dispense  Refill  . aspirin 81 MG tablet   Oral   Take 81 mg by mouth daily.         . cyclobenzaprine (FLEXERIL) 10  MG tablet   Oral   Take 10 mg by mouth daily as needed for muscle spasms.         Marland Kitchen dicyclomine (BENTYL) 10 MG capsule   Oral   Take 10 mg by mouth 4 (four) times daily.         Marland Kitchen esomeprazole (NEXIUM) 40 MG capsule   Oral   Take 40 mg by mouth 2 (two) times daily before a meal.          . fluticasone (FLONASE) 50 MCG/ACT nasal spray   Each Nare   Place 2 sprays into both nostrils daily.          Marland Kitchen ibuprofen (ADVIL,MOTRIN) 200 MG tablet   Oral   Take  400 mg by mouth every 6 (six) hours as needed for moderate pain.         Marland Kitchen lactulose (CHRONULAC) 10 GM/15ML solution   Oral   Take 10 g by mouth 2 (two) times daily as needed for mild constipation.         Marland Kitchen levothyroxine (SYNTHROID, LEVOTHROID) 100 MCG tablet   Oral   Take 100 mcg by mouth daily before breakfast.         . lidocaine (LIDODERM) 5 %   Transdermal   Place 1 patch onto the skin daily as needed (Back Pain). Remove & Discard patch within 12 hours or as directed by MD         . ondansetron (ZOFRAN-ODT) 8 MG disintegrating tablet   Oral   Take 8 mg by mouth every 8 (eight) hours as needed for nausea or vomiting.         Marland Kitchen oxyCODONE-acetaminophen (PERCOCET/ROXICET) 5-325 MG per tablet   Oral   Take 1 tablet by mouth 3 (three) times daily as needed for moderate pain or severe pain.          . polyethylene glycol (MIRALAX / GLYCOLAX) packet   Oral   Take 17 g by mouth daily as needed for mild constipation.          . pravastatin (PRAVACHOL) 40 MG tablet   Oral   Take 40 mg by mouth daily.         . prochlorperazine (COMPAZINE) 10 MG tablet   Oral   Take 10 mg by mouth every 6 (six) hours as needed for nausea or vomiting.         . promethazine (PHENERGAN) 25 MG tablet   Oral   Take 25 mg by mouth every 6 (six) hours as needed for nausea or vomiting.         . venlafaxine XR (EFFEXOR-XR) 75 MG 24 hr capsule   Oral   Take 75 mg by mouth daily with breakfast.         . VITAMIN D, CHOLECALCIFEROL, PO   Oral   Take 1 tablet by mouth daily.         . promethazine (PHENERGAN) 25 MG suppository   Rectal   Place 1 suppository (25 mg total) rectally every 6 (six) hours as needed for nausea or vomiting.   12 each   0    BP 128/59  Pulse 88  Temp(Src) 99.1 F (37.3 C) (Oral)  Resp 14  Ht 5' (1.524 m)  Wt 125 lb (56.7 kg)  BMI 24.41 kg/m2  SpO2 100% Physical Exam  Nursing note and vitals reviewed. Constitutional: Vital signs are  normal. She appears well-developed and well-nourished. No distress.  HENT:  Head: Normocephalic  and atraumatic.  Cardiovascular: Normal rate and regular rhythm.   Pulmonary/Chest: Not tachypneic. No respiratory distress. She has no decreased breath sounds. She has no wheezes. She has no rales.  Patient is able to speak in complete sentences.     Abdominal: Soft. Bowel sounds are normal. There is generalized tenderness. There is no CVA tenderness, no tenderness at McBurney's point and negative Murphy's sign.  General abdominal tenderness to palpation, Moderate in RUQ and epigastrium.  Multiple well healed surgical scars.  Skin: She is not diaphoretic.    ED Course  Procedures (including critical care time) Labs Review Labs Reviewed  CBC WITH DIFFERENTIAL - Abnormal; Notable for the following:    RBC 3.85 (*)    Monocytes Absolute 1.1 (*)    All other components within normal limits  COMPREHENSIVE METABOLIC PANEL - Abnormal; Notable for the following:    Albumin 3.3 (*)    AST 53 (*)    Alkaline Phosphatase 144 (*)    GFR calc non Af Amer 59 (*)    GFR calc Af Amer 69 (*)    All other components within normal limits  URINALYSIS, ROUTINE W REFLEX MICROSCOPIC - Abnormal; Notable for the following:    APPearance CLOUDY (*)    pH 8.5 (*)    Leukocytes, UA TRACE (*)    All other components within normal limits  URINE MICROSCOPIC-ADD ON - Abnormal; Notable for the following:    Squamous Epithelial / LPF FEW (*)    All other components within normal limits  LIPASE, BLOOD  I-STAT TROPOININ, ED   Imaging Review Dg Chest 2 View  05/14/2013   CLINICAL DATA:  Right upper and right lower quadrant abdominal pain and vomiting for 1 month, history COPD, esophageal cancer  EXAM: CHEST  2 VIEW  COMPARISON:  Chest radiograph 05/08/2013  FINDINGS: Normal heart size, mediastinal contours, and pulmonary vascularity.  Surgical clips at mediastinum.  Atherosclerotic calcification aorta.   Emphysematous changes without infiltrate, pleural effusion or pneumothorax.  Bones demineralized.  IMPRESSION: COPD changes.  No acute abnormalities.   Electronically Signed   By: Lavonia Dana M.D.   On: 05/14/2013 17:01   US Abdomen Complete  05/14/2013   CLINICAL DATA:  Abdominal pain and vomiting.  EXAM: ULTRASOUND ABDOMEN COMPLETE  COMPARISON:  CT abdomen and pelvis 04/27/2013  FINDINGS: Gallbladder:  No wall thickening. Negative sonographic Murphy's sign. 5 mm echogenic focus which appeared adherent to the wall without shadowing.  Common bile duct:  Diameter: 6.5 mm  Liver:  No focal lesion identified. Within normal limits in parenchymal echogenicity.  IVC:  No abnormality visualized.  Pancreas:  Limited visualization due to overlying bowel gas.  Spleen:  Size and appearance within normal limits.  Right Kidney:  Length: 10.2 cm. Echogenicity within normal limits. No mass or hydronephrosis visualized.  Left Kidney:  Length: 9.8 cm. Suboptimal visualization due to bowel gas without hydronephrosis.  Abdominal aorta:  Atherosclerosis without evidence of aneurysm.  Other findings:  None.  IMPRESSION: Small echogenic focus in the gallbladder, which may represent sludge or non shadowing stone. No gallbladder wall thickening or other evidence of acute cholecystitis. No biliary dilatation.   Electronically Signed   By: Logan Bores   On: 05/14/2013 21:21   Dg Abd 2 Views  05/14/2013   CLINICAL DATA:  Abdominal pain.  Vomiting.  Esophageal carcinoma.  EXAM: ABDOMEN - 2 VIEW  COMPARISON:  05/08/2013  FINDINGS: The bowel gas pattern is normal. There is no evidence of  free air. No radio-opaque calculi or other significant radiographic abnormality is seen. Surgical clips noted in the left abdomen.  IMPRESSION: No acute findings.   Electronically Signed   By: Earle Gell M.D.   On: 05/14/2013 17:07     EKG Interpretation   Date/Time:  Sunday May 14 2013 14:41:02 EDT Ventricular Rate:  90 PR Interval:  135 QRS  Duration: 89 QT Interval:  386 QTC Calculation: 472 R Axis:   40 Text Interpretation:  Sinus rhythm RSR' in V1 or V2, right VCD or RVH  since last tracing no significant change Confirmed by BELFI  MD, MELANIE  ([id number]) on 05/14/2013 3:39:38 PM      MDM   Final diagnoses:  Abdominal pain  Chest pain   Pt with a history of GI complains, I personally evaluated her on 05/08/2013.  She is followed by [facility] for further evaluation and EGD and colonoscopy testing has been scheduled.  She has had several CT in the past 3 months that has shown nonobstructive constipation.  Today with right sided and epigastric pain, [place] to rule out gall bladder disease. EMR shows the patient was scheduled for an NM myocardial perfusion SPECT with [facility], despite multiple attempts to contact the patient the patient has not followed up as of 05/12/2013. [facility] shows EGD/COLON scheduled for 4/20 but needs anesthesia clearance prior to this. Chest XR without acute findings, Abdomen XR without signs of obstruction. Re-eval Pt reports partial relief of symptoms. discussed current results awaiting [place] results. US shows possible gall stones.  Referral for Cardiology for ongoing chest pain for months, GI for ongoing abdominal complaints and relocating to [facility] from the [place] area, and general surgery for possible treatment of gall stones. Discussed lab results, imaging results, and treatment plan with the patient. Return precautions given. Reports understanding and no other concerns at this time.  Patient is stable for discharge at this time.  Meds given in ED:  Medications  ondansetron (ZOFRAN) injection 4 mg (4 mg Intravenous Given 05/14/13 1454)  0.9 %  sodium chloride infusion ( Intravenous New Bag/Given 05/14/13 1611)  ondansetron (ZOFRAN) injection 4 mg (4 mg Intravenous Given 05/14/13 1610)  morphine 4 MG/ML injection 4 mg (4 mg Intravenous Given 05/14/13 1610)  morphine 4 MG/ML injection 4 mg (4 mg  Intravenous Given 05/14/13 1932)    New Prescriptions   No medications on file      Will have her follow up locally for out-pt stress test, and GI follow up    Lorrine Kin, PA-C 05/16/13 1207

## 2013-05-14 NOTE — Discharge Instructions (Signed)
Call for a follow up appointment with [facility] for further evaluation of your abdominal pain. Call [facility] for further evaluation of your chest pain. Return if Symptoms worsen.   Take medication as prescribed.    Emergency Department Resource Guide 1) Find a Doctor and Pay Out of Pocket Although you won't have to find out who is covered by your insurance plan, it is a good idea to ask around and get recommendations. You will then need to call the office and see if the doctor you have chosen will accept you as a new patient and what types of options they offer for patients who are self-pay. Some doctors offer discounts or will set up payment plans for their patients who do not have insurance, but you will need to ask so you aren't surprised when you get to your appointment.  2) Contact Your Local Health Department Not all health departments have doctors that can see patients for sick visits, but many do, so it is worth a call to see if yours does. If you don't know where your local health department is, you can check in your phone book. The CDC also has a tool to help you locate your state's health department, and many state websites also have listings of all of their local health departments.  3) Find a [facility] If your illness is not likely to be very severe or complicated, you may want to try a walk in clinic. These are popping up all over the country in pharmacies, drugstores, and shopping centers. They're usually staffed by nurse practitioners or physician assistants that have been trained to treat common illnesses and complaints. They're usually fairly quick and inexpensive. However, if you have serious medical issues or chronic medical problems, these are probably not your best option.  No Primary Care Doctor: - Call Health Connect at  [phone] - they can help you locate a primary care doctor that  accepts your insurance, provides certain services, etc. - Physician Referral  Service- [phone]  Chronic Pain Problems: Organization         Address  Phone   Notes  [facility]  [phone] Patients need to be referred by their primary care doctor.   Medication Assistance: Organization         Address  Phone   Notes  [facility] Medication [facility] [address] [place], [place] [address] [phone] --Must be a resident of [facility] -- Must have NO insurance coverage whatsoever (no Medicaid/ Medicare, etc.) -- The pt. MUST have a primary care doctor that directs their care regularly and follows them in the community   MedAssist  [phone]   [employer]  [phone]    Agencies that provide inexpensive medical care: Organization         Address  Phone   Notes  [place]  [phone]   [facility]    [phone]   [facility] [place], [place] [address] [phone]   [place] [address] [place] [phone]   Planned Parenthood    [phone]   [facility]    [phone]   [place] and [address], [place] Phone:  [phone], Fax:  [phone] Hours of Operation:  9 am - 6 pm, M-F.  Also accepts Medicaid/Medicare and self-pay.  [facility]  [place] for [place] [address], [address], [place] Phone: [phone], Fax: [phone]. Hours of Operation:  8:30 am - 5:30 pm, M-F.  Also accepts Medicaid and self-pay.  [facility] [place] [address] [place] Phone: [phone]   [place], [place], [place] [phone], Ext. [phone] Mondays & Thursdays: 7-9 AM.  First 15 patients are seen on a first come, first serve basis.    [place] Providers:  Organization         Address  Phone   Notes  [facility] [address], [place] [phone] Also accepts self-pay patients.  [facility] [address], [place]  [phone]   [place], [address], [place] [phone]   [facility] [address] [place] [phone]   Lucianne Lei [address], [place]   [phone] Only accepts [place] Access [place] patients after they have their name applied to their card.   Self-Pay (no insurance) in [facility]:  Organization         Address  Phone   Notes  Sickle Cell Patients, [facility] [place] [phone]   [facility] Urgent Care [place] [phone]   [facility] [place]  [place], [place], [place] [phone]   [facility]/Dr. Osei-Bonsu  [address], [place] or [address], [place] [phone] Phone number for both [place] and [place] locations is the same.  Urgent Medical and [facility] [address] [place] [phone]   [facility] [address] [place] or [address] [phone]   [facility] [address], [place] [phone], phone; [phone], fax Sees patients 1st and 3rd Saturday of every month.  Must not qualify for public or private insurance (i.e. Medicaid, Medicare, [place] Health Choice, Veterans' Benefits)  Household income should be no more than 200% of the poverty level The clinic cannot treat you if you are pregnant or think you are pregnant  Sexually transmitted diseases are not treated at the clinic.    Dental Care: Organization         Address  Phone  Notes  [facility] of [facility] [place] [phone] Accepts children up to age 23 who are enrolled in  [place] or [place]; pregnant women with a Medicaid card; and children who have applied for Medicaid or [place] Health Choice, but were declined, whose parents can pay a reduced fee at time of service.  [facility] Department of [facility]  [address], [place] [phone] Accepts children up to age 30 who are enrolled in [place] or [place]; pregnant women with a Medicaid card; and children who have applied for Medicaid or [place] Health Choice, but were declined, whose parents can pay a reduced fee at time of service.  [facility] PRO[facility]  [place] [phone] Patients are seen by appointment only. Walk-ins are not accepted. [place] will see patients 70 years of age and older. Monday - Tuesday (8am-5pm) Most Wednesdays (8:30-5pm) $30 per visit,  cash only  [employer] Adult [employer] PROGRAM  [address], [place] [phone] Patients are seen by appointment only. Walk-ins are not accepted. [place] will see patients 68 years of age and older. One Wednesday Evening (Monthly: Volunteer Based).  $30 per visit, cash only  [place]  [phone] for adults; Children under age 32, call Graduate Pediatric Dentistry at [phone]. Children aged 36-14, please call [phone] to request a pediatric application.  Dental services are provided in all areas of dental care including fillings, crowns and bridges, complete and partial dentures, implants, gum treatment, root canals, and extractions. Preventive care is also provided. Treatment is provided to both adults and children. Patients are selected via a lottery and there is often a waiting list.   [facility] [address] [place]  [phone] [email or web address]   [facility] [address] [place], [place] [phone], Ext. [phone] Second and Fourth Thursday of each month, opens at 6:30  AM; Clinic ends at 9 AM.  Patients are seen on a first-come first-served basis, and a limited number are seen during each clinic.   [facility]  [address] Hillard Danker [place], [place] [phone]   Eligibility Requirements You must have lived in [place], [place], or [place] counties for at least the last three months.   You cannot be eligible for state or federal sponsored [employer], including [employer], [place], or [employer].   You generally cannot be eligible for healthcare insurance through your employer.    How to apply: Eligibility screenings are held every Tuesday and Wednesday afternoon from 1:00 pm until 4:00 pm. You do not need an appointment for the interview!  [facility] [address] [place], [place]   [place]  [place] Department  [place]  [phone]    Behavioral Health Resources in the Community: Intensive Outpatient Programs Organization         Address  Phone  Notes  Bejou Flanagan. [address], [place], [place] [phone]   [facility] Outpatient [address] [place], [place]   ADS: Alcohol & Drug Svcs [address] [place], [place]   [place] [address],  [place], [place] or [phone]   Substance Abuse Resources Organization         Address  Phone  Notes  Alcohol and Drug Services  [phone]   [place]  [phone]   [facility] [place]   Chinita Pester  [phone]   Residential & Outpatient Substance Abuse Program  [phone]   Psychological Services Organization         Address  Phone  Notes  [facility] [place]  [place]  [phone]   [place] [address] [place] or  [phone]    Mobile Crisis Teams Organization         Address  Phone  Notes  [facility], Mobile Crisis Care Unit  [phone]   [facility]  [address] [place], [place]   [place] [address], [place] [phone]    Self-Help/Support Groups Organization         Address  Phone  Notes  [facility]ental [facility] Assoc[facility] of [place] - variety of support groups  [place] Call for more information  Narcotics Anonymous (NA), [facility] [address], [employer] [place]  2 meetings at this location   Special educational needs teacher         Address  Phone  Notes  ASAP Residential Treatment [place],    [place]  [phone]   [facility]  [address] [place] [phone], [place], [place]   [place], [place] [phone] Admissions: 8am-3pm M-F  Incentives Substance [place] [address]    [place], [place] [phone]   [facility] [address] [place], [place], [place]   [facility] [address],  [place], [place]   [facility] Programs - Intensive Outpatient [address] Kristeen Mans [address], [place], [place]   [facility] [address]  [place], [place] [phone] or [phone]   [facility] Services (RTS) [address] [place], [place] Accepts Medicaid  [facility] [place] [address],  [place] [phone] Substance Abuse/Addiction Treatment   [facility] The Organization         Address  Phone  Notes  [facility]  [phone]   Domenic Schwab, PhD [address] Arlis Porta [place], [place]   [phone] or [phone]   [place] and [place], [place] [phone]   [facility] [address],  [place], [place] [phone] Insurance/Medicaid/sponsorship through [facility] and Families [address], [address] [place]                                    [place], [place] [phone] [place] [address] [place], [place] [phone]    Dr. Adele Schilder  [phone]   [facility] Dept. 1) [address], [place] 2) [place] 3)  [place] [address], [place] [phone]  [phone]   [place] [phone] or [phone] (After Hours)

## 2013-05-14 NOTE — ED Notes (Signed)
Pt presents to department for evaluation of diffuse abdominal pain and nausea/vomiting. Ongoing x1 month. 10/10 pain upon arrival. Family member states she was diagnosed with constipation and seen several times for the same symptoms. Pt is alert and oriented x4.

## 2013-05-17 NOTE — ED Provider Notes (Signed)
Medical screening examination/treatment/procedure(s) were performed by non-physician practitioner and as supervising physician I was immediately available for consultation/collaboration.   EKG Interpretation   Date/Time:  Sunday May 14 2013 14:41:02 EDT Ventricular Rate:  90 PR Interval:  135 QRS Duration: 89 QT Interval:  386 QTC Calculation: 472 R Axis:   40 Text Interpretation:  Sinus rhythm RSR' in V1 or V2, right VCD or RVH  since last tracing no significant change Confirmed by Shonta Phillis  MD, Araly Kaas  ([id number]) on 05/14/2013 3:39:38 PM        Malvin Johns, MD 05/17/13 [phone]

## 2013-05-22 ENCOUNTER — Ambulatory Visit (INDEPENDENT_AMBULATORY_CARE_PROVIDER_SITE_OTHER): Payer: Medicare Other | Admitting: General Surgery

## 2013-05-22 ENCOUNTER — Encounter (INDEPENDENT_AMBULATORY_CARE_PROVIDER_SITE_OTHER): Payer: Self-pay | Admitting: General Surgery

## 2013-05-22 VITALS — BP 118/60 | HR 70 | Temp 97.4°F | Resp 16 | Ht 60.0 in | Wt 125.8 lb

## 2013-05-22 DIAGNOSIS — K5900 Constipation, unspecified: Secondary | ICD-10-CM

## 2013-05-22 NOTE — Progress Notes (Signed)
Patient ID: Makayla Thompson, female   DOB: 03-Oct-1945, 68 y.o.   MRN: [id number]  Chief Complaint  Patient presents with  . New Evaluation    eval gallstones/sludge    HPI Makayla Thompson is a 68 y.o. female.  The patient is a 68 year old female who was referred here secondary to upper abdominal pain. The patient has a complex medical and surgical history to include esophageal cancer status post [place] esophagogastrectomy in 2011.  Which I do not have the records . Patient required multiple dilations most recently 2013. The patient states that her upper abdominal pain started proximally 4-5 weeks ago. She states that this was associated with becoming constipated. Patient is an ER several times and has had several enemas. The patient states that her bowel habits have changed from 3-4 times a week to one time a week. She states that the only thing that helps with the pain is Percocet.  The patient states she only takes one MiraLax a day.  The patient on ultrasound which revealed sludge in the gallbladder, However no overt stones were seen. HPI  Past Medical History  Diagnosis Date  . Cancer   . Ulcer   . GERD (gastroesophageal reflux disease)   . Thyroid disease   . Cystitis   . DDD (degenerative disc disease)   . COPD (chronic obstructive pulmonary disease)   . Chronic pain   . Hyperlipidemia     Past Surgical History  Procedure Laterality Date  . Eye surgery      Family History  Problem Relation Age of Onset  . Cancer Sister     Social History History  Substance Use Topics  . Smoking status: Current Every Day Smoker -- 1.00 packs/day    Types: Cigarettes  . Smokeless tobacco: Not on file  . Alcohol Use: No    Allergies  Allergen Reactions  . Amitriptyline Other (See Comments)    Memory loss   . Codeine Other (See Comments)    Nausea and diarrhea   . Eggs Or Egg-Derived Products Nausea And Vomiting  . Metoprolol Other (See Comments)    fatigue and head ache   .  Sulfa Antibiotics Other (See Comments)    Rash   . Verapamil Other (See Comments)    dizziness    Current Outpatient Prescriptions  Medication Sig Dispense Refill  . aspirin 81 MG tablet Take 81 mg by mouth daily.      . cyclobenzaprine (FLEXERIL) 10 MG tablet Take 10 mg by mouth daily as needed for muscle spasms.      [place] esomeprazole (NEXIUM) 40 MG capsule Take 40 mg by mouth 2 (two) times daily before a meal.       . fluticasone (FLONASE) 50 MCG/ACT nasal spray Place 2 sprays into both nostrils daily.       [place] ibuprofen (ADVIL,MOTRIN) 200 MG tablet Take 400 mg by mouth every 6 (six) hours as needed for moderate pain.      [place] lactulose (CHRONULAC) 10 GM/15ML solution Take 10 g by mouth 2 (two) times daily as needed for mild constipation.      [place] levothyroxine (SYNTHROID, LEVOTHROID) 100 MCG tablet Take 100 mcg by mouth daily before breakfast.      . ondansetron (ZOFRAN-ODT) 8 MG disintegrating tablet Take 8 mg by mouth every 8 (eight) hours as needed for nausea or vomiting.      [place] oxyCODONE-acetaminophen (PERCOCET/ROXICET) 5-325 MG per tablet Take 1 tablet by mouth 3 (three) times daily as needed for  moderate pain or severe pain.       . polyethylene glycol (MIRALAX / GLYCOLAX) packet Take 17 g by mouth daily as needed for mild constipation.       . pravastatin (PRAVACHOL) 40 MG tablet Take 40 mg by mouth daily.      . promethazine (PHENERGAN) 25 MG suppository Place 1 suppository (25 mg total) rectally every 6 (six) hours as needed for nausea or vomiting.  20 each  0  . traMADol (ULTRAM) 50 MG tablet Take 1 tablet (50 mg total) by mouth every 6 (six) hours as needed.  20 tablet  0  . venlafaxine XR (EFFEXOR-XR) 75 MG 24 hr capsule Take 75 mg by mouth daily with breakfast.      . VITAMIN D, CHOLECALCIFEROL, PO Take 1 tablet by mouth daily.      [place] lidocaine (LIDODERM) 5 % Place 1 patch onto the skin daily as needed (Back Pain). Remove & Discard patch within 12 hours or as directed by MD       No  current facility-administered medications for this visit.    Review of Systems Review of Systems  Constitutional: Negative.   HENT: Negative.   Respiratory: Negative.   Cardiovascular: Negative.   Gastrointestinal: Negative.   Neurological: Negative.   All other systems reviewed and are negative.    Blood pressure 118/60, pulse 70, temperature 97.4 F (36.3 C), temperature source Temporal, resp. rate 16, height 5' (1.524 m), weight 125 lb 12.8 oz (57.063 kg).  Physical Exam Physical Exam  Constitutional: She is oriented to person, place, and time. She appears well-developed and well-nourished.  HENT:  Head: Normocephalic and atraumatic.  Eyes: Conjunctivae and EOM are normal. Pupils are equal, round, and reactive to light.  Neck: Normal range of motion. Neck supple.  Cardiovascular: Normal rate, regular rhythm and normal heart sounds.   Pulmonary/Chest: Effort normal and breath sounds normal.  Abdominal: Soft. Bowel sounds are normal. She exhibits no distension and no mass. There is no tenderness. There is no rebound and no guarding.    Well-healed midline incision  Musculoskeletal: Normal range of motion.  Neurological: She is alert and oriented to person, place, and time.  Skin: Skin is warm and dry.  Psychiatric: She has a normal mood and affect.    Data Reviewed Ultrasound revealed sludge in the gallbladder.  Assessment    68 year old female with nonspecified abdominal pain, likely related to constipation     Plan    1. Discussed with patient that her pain is likely secondary to constipation. I believe get her back for 3-4 times a week bowel movement would likely relieve her abdominal pain complaints. Discussed with her and recommended taking MiraLax at least twice a day as well as staying hydrated and also discussed 30 g of fiber daily. I discussed with her Metamucil fiber disease easier to measure.  2 I do not believe her complaints are related to her  gallbladder. Her abdominal pain is not related to food or fatty foods. 3. 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 4. Patient to follow up as needed       Rosario Jacks., Anne Hahn 05/22/2013, 2:22 PM

## 2013-05-23 ENCOUNTER — Telehealth (INDEPENDENT_AMBULATORY_CARE_PROVIDER_SITE_OTHER): Payer: Self-pay

## 2013-05-23 ENCOUNTER — Other Ambulatory Visit: Payer: Self-pay

## 2013-05-23 NOTE — Telephone Encounter (Signed)
Pt was seen yesterday by Dr. Rosendo Gros for abdominal pain.  Her daughter calls today for clarification on the Miralax dosing and Metamucil dosing.  Instructions given from office note.

## 2013-05-24 ENCOUNTER — Encounter: Payer: Medicare Other | Admitting: Cardiovascular Disease

## 2013-05-25 ENCOUNTER — Ambulatory Visit: Payer: Medicare Other | Admitting: Physician Assistant

## 2013-06-03 ENCOUNTER — Emergency Department (HOSPITAL_COMMUNITY): Payer: Medicare Other

## 2013-06-03 ENCOUNTER — Encounter (HOSPITAL_COMMUNITY): Payer: Self-pay | Admitting: Emergency Medicine

## 2013-06-03 ENCOUNTER — Inpatient Hospital Stay (HOSPITAL_COMMUNITY)
Admission: EM | Admit: 2013-06-03 | Discharge: 2013-06-03 | DRG: 871 | Disposition: A | Discharge status: Other Acute Inpatient Hospital | Payer: Medicare Other | Attending: Internal Medicine | Admitting: Internal Medicine

## 2013-06-03 ENCOUNTER — Inpatient Hospital Stay (HOSPITAL_COMMUNITY): Payer: Medicare Other

## 2013-06-03 DIAGNOSIS — E785 Hyperlipidemia, unspecified: Secondary | ICD-10-CM | POA: Diagnosis present

## 2013-06-03 DIAGNOSIS — N179 Acute kidney failure, unspecified: Secondary | ICD-10-CM | POA: Diagnosis present

## 2013-06-03 DIAGNOSIS — K922 Gastrointestinal hemorrhage, unspecified: Secondary | ICD-10-CM

## 2013-06-03 DIAGNOSIS — Z79899 Other long term (current) drug therapy: Secondary | ICD-10-CM

## 2013-06-03 DIAGNOSIS — E039 Hypothyroidism, unspecified: Secondary | ICD-10-CM | POA: Diagnosis present

## 2013-06-03 DIAGNOSIS — K920 Hematemesis: Secondary | ICD-10-CM | POA: Diagnosis present

## 2013-06-03 DIAGNOSIS — E860 Dehydration: Secondary | ICD-10-CM | POA: Diagnosis present

## 2013-06-03 DIAGNOSIS — Z7982 Long term (current) use of aspirin: Secondary | ICD-10-CM

## 2013-06-03 DIAGNOSIS — E872 Acidosis, unspecified: Secondary | ICD-10-CM | POA: Diagnosis present

## 2013-06-03 DIAGNOSIS — Z720 Tobacco use: Secondary | ICD-10-CM | POA: Diagnosis present

## 2013-06-03 DIAGNOSIS — A419 Sepsis, unspecified organism: Principal | ICD-10-CM | POA: Diagnosis present

## 2013-06-03 DIAGNOSIS — Z8501 Personal history of malignant neoplasm of esophagus: Secondary | ICD-10-CM

## 2013-06-03 DIAGNOSIS — N390 Urinary tract infection, site not specified: Secondary | ICD-10-CM

## 2013-06-03 DIAGNOSIS — K750 Abscess of liver: Secondary | ICD-10-CM | POA: Diagnosis present

## 2013-06-03 DIAGNOSIS — R55 Syncope and collapse: Secondary | ICD-10-CM | POA: Diagnosis present

## 2013-06-03 DIAGNOSIS — D649 Anemia, unspecified: Secondary | ICD-10-CM | POA: Diagnosis present

## 2013-06-03 DIAGNOSIS — K838 Other specified diseases of biliary tract: Secondary | ICD-10-CM

## 2013-06-03 DIAGNOSIS — J4489 Other specified chronic obstructive pulmonary disease: Secondary | ICD-10-CM | POA: Diagnosis present

## 2013-06-03 DIAGNOSIS — K266 Chronic or unspecified duodenal ulcer with both hemorrhage and perforation: Secondary | ICD-10-CM | POA: Diagnosis present

## 2013-06-03 DIAGNOSIS — K219 Gastro-esophageal reflux disease without esophagitis: Secondary | ICD-10-CM | POA: Diagnosis present

## 2013-06-03 DIAGNOSIS — J449 Chronic obstructive pulmonary disease, unspecified: Secondary | ICD-10-CM | POA: Diagnosis present

## 2013-06-03 DIAGNOSIS — F17200 Nicotine dependence, unspecified, uncomplicated: Secondary | ICD-10-CM | POA: Diagnosis present

## 2013-06-03 DIAGNOSIS — I959 Hypotension, unspecified: Secondary | ICD-10-CM | POA: Diagnosis present

## 2013-06-03 DIAGNOSIS — K831 Obstruction of bile duct: Secondary | ICD-10-CM | POA: Diagnosis present

## 2013-06-03 DIAGNOSIS — K5900 Constipation, unspecified: Secondary | ICD-10-CM | POA: Diagnosis present

## 2013-06-03 DIAGNOSIS — G8929 Other chronic pain: Secondary | ICD-10-CM | POA: Diagnosis present

## 2013-06-03 LAB — URINE MICROSCOPIC-ADD ON

## 2013-06-03 LAB — PROTIME-INR
INR: 1.26 (ref 0.00–1.49)
Prothrombin Time: 15.5 seconds — ABNORMAL HIGH (ref 11.6–15.2)

## 2013-06-03 LAB — URINALYSIS, ROUTINE W REFLEX MICROSCOPIC
GLUCOSE, UA: NEGATIVE mg/dL
KETONES UR: NEGATIVE mg/dL
Nitrite: NEGATIVE
PH: 6.5 (ref 5.0–8.0)
PROTEIN: NEGATIVE mg/dL
Specific Gravity, Urine: 1.015 (ref 1.005–1.030)
Urobilinogen, UA: 0.2 mg/dL (ref 0.0–1.0)

## 2013-06-03 LAB — CBC WITH DIFFERENTIAL/PLATELET
Basophils Absolute: 0.1 10*3/uL (ref 0.0–0.1)
Basophils Relative: 0 % (ref 0–1)
EOS ABS: 0.1 10*3/uL (ref 0.0–0.7)
EOS PCT: 1 % (ref 0–5)
HCT: 24.5 % — ABNORMAL LOW (ref 36.0–46.0)
HEMOGLOBIN: 8 g/dL — AB (ref 12.0–15.0)
Lymphocytes Relative: 11 % — ABNORMAL LOW (ref 12–46)
Lymphs Abs: 1.4 10*3/uL (ref 0.7–4.0)
MCH: 32.3 pg (ref 26.0–34.0)
MCHC: 32.7 g/dL (ref 30.0–36.0)
MCV: 98.8 fL (ref 78.0–100.0)
MONO ABS: 1 10*3/uL (ref 0.1–1.0)
Monocytes Relative: 8 % (ref 3–12)
NEUTROS ABS: 10.4 10*3/uL — AB (ref 1.7–7.7)
NEUTROS PCT: 80 % — AB (ref 43–77)
Platelets: 308 10*3/uL (ref 150–400)
RBC: 2.48 MIL/uL — AB (ref 3.87–5.11)
RDW: 15.2 % (ref 11.5–15.5)
WBC: 13.1 10*3/uL — ABNORMAL HIGH (ref 4.0–10.5)

## 2013-06-03 LAB — IRON AND TIBC
Iron: 152 ug/dL — ABNORMAL HIGH (ref 42–135)
SATURATION RATIOS: 71 % — AB (ref 20–55)
TIBC: 215 ug/dL — AB (ref 250–470)
UIBC: 63 ug/dL — AB (ref 125–400)

## 2013-06-03 LAB — FIBRINOGEN: Fibrinogen: 481 mg/dL — ABNORMAL HIGH (ref 204–475)

## 2013-06-03 LAB — I-STAT CHEM 8, ED
BUN: 24 mg/dL — AB (ref 6–23)
CALCIUM ION: 1.02 mmol/L — AB (ref 1.13–1.30)
CHLORIDE: 108 meq/L (ref 96–112)
CREATININE: 1.4 mg/dL — AB (ref 0.50–1.10)
Glucose, Bld: 145 mg/dL — ABNORMAL HIGH (ref 70–99)
HCT: 27 % — ABNORMAL LOW (ref 36.0–46.0)
Hemoglobin: 9.2 g/dL — ABNORMAL LOW (ref 12.0–15.0)
Potassium: 3.8 mEq/L (ref 3.7–5.3)
Sodium: 140 mEq/L (ref 137–147)
TCO2: 20 mmol/L (ref 0–100)

## 2013-06-03 LAB — ACETAMINOPHEN LEVEL

## 2013-06-03 LAB — APTT: APTT: 23 s — AB (ref 24–37)

## 2013-06-03 LAB — COMPREHENSIVE METABOLIC PANEL
ALT: 172 U/L — ABNORMAL HIGH (ref 0–35)
AST: 112 U/L — ABNORMAL HIGH (ref 0–37)
Albumin: 2.1 g/dL — ABNORMAL LOW (ref 3.5–5.2)
Alkaline Phosphatase: 246 U/L — ABNORMAL HIGH (ref 39–117)
BUN: 25 mg/dL — AB (ref 6–23)
CALCIUM: 7.5 mg/dL — AB (ref 8.4–10.5)
CO2: 17 meq/L — AB (ref 19–32)
CREATININE: 1.16 mg/dL — AB (ref 0.50–1.10)
Chloride: 105 mEq/L (ref 96–112)
GFR, EST AFRICAN AMERICAN: 55 mL/min — AB (ref >90)
GFR, EST NON AFRICAN AMERICAN: 48 mL/min — AB (ref >90)
GLUCOSE: 134 mg/dL — AB (ref 70–99)
Potassium: 3.8 mEq/L (ref 3.7–5.3)
Sodium: 140 mEq/L (ref 137–147)
Total Bilirubin: 6.1 mg/dL — ABNORMAL HIGH (ref 0.3–1.2)
Total Protein: 5.3 g/dL — ABNORMAL LOW (ref 6.0–8.3)

## 2013-06-03 LAB — MRSA PCR SCREENING: MRSA by PCR: INVALID — AB

## 2013-06-03 LAB — LIPASE, BLOOD: Lipase: 187 U/L — ABNORMAL HIGH (ref 11–59)

## 2013-06-03 LAB — ABO/RH: ABO/RH(D): O POS

## 2013-06-03 LAB — LACTATE DEHYDROGENASE: LDH: 266 U/L — AB (ref 94–250)

## 2013-06-03 LAB — AMMONIA: AMMONIA: 19 umol/L (ref 11–60)

## 2013-06-03 LAB — FERRITIN: FERRITIN: 661 ng/mL — AB (ref 10–291)

## 2013-06-03 LAB — CBG MONITORING, ED: Glucose-Capillary: 116 mg/dL — ABNORMAL HIGH (ref 70–99)

## 2013-06-03 LAB — I-STAT CG4 LACTIC ACID, ED: Lactic Acid, Venous: 2.77 mmol/L — ABNORMAL HIGH (ref 0.5–2.2)

## 2013-06-03 LAB — HEMOGLOBIN AND HEMATOCRIT, BLOOD
HCT: 25.2 % — ABNORMAL LOW (ref 36.0–46.0)
Hemoglobin: 8.6 g/dL — ABNORMAL LOW (ref 12.0–15.0)

## 2013-06-03 LAB — BILIRUBIN, DIRECT: BILIRUBIN DIRECT: 5.6 mg/dL — AB (ref 0.0–0.3)

## 2013-06-03 LAB — PREPARE RBC (CROSSMATCH)

## 2013-06-03 LAB — I-STAT TROPONIN, ED: Troponin i, poc: 0 ng/mL (ref 0.00–0.08)

## 2013-06-03 LAB — LACTIC ACID, PLASMA: Lactic Acid, Venous: 3 mmol/L — ABNORMAL HIGH (ref 0.5–2.2)

## 2013-06-03 MED ORDER — PIPERACILLIN-TAZOBACTAM 3.375 G IVPB
3.3750 g | Freq: Three times a day (TID) | INTRAVENOUS | Status: DC
  Filled 2013-06-03 (×3): qty 50

## 2013-06-03 MED ORDER — PIPERACILLIN-TAZOBACTAM 3.375 G IVPB: 3.3750 g | Freq: Three times a day (TID) | INTRAVENOUS | Status: AC

## 2013-06-03 MED ORDER — HYDROMORPHONE HCL PF 1 MG/ML IJ SOLN: 2.0000 mg | INTRAMUSCULAR | Status: AC | PRN

## 2013-06-03 MED ORDER — PANTOPRAZOLE SODIUM 40 MG IV SOLR: 80.0000 mg | Freq: Two times a day (BID) | INTRAVENOUS | Status: DC

## 2013-06-03 MED ORDER — ONDANSETRON HCL 4 MG PO TABS: 4.0000 mg | ORAL_TABLET | Freq: Four times a day (QID) | ORAL | Status: DC | PRN

## 2013-06-03 MED ORDER — METRONIDAZOLE IN NACL 5-0.79 MG/ML-% IV SOLN
500.0000 mg | Freq: Three times a day (TID) | INTRAVENOUS | Status: DC
  Filled 2013-06-03 (×2): qty 100

## 2013-06-03 MED ORDER — FLUTICASONE PROPIONATE 50 MCG/ACT NA SUSP
2.0000 | Freq: Every day | NASAL | Status: DC
  Administered 2013-06-03: 2 via NASAL
  Filled 2013-06-03: qty 16

## 2013-06-03 MED ORDER — LEVOTHYROXINE SODIUM 100 MCG PO TABS: 100.0000 ug | ORAL_TABLET | Freq: Every day | ORAL | Status: DC

## 2013-06-03 MED ORDER — VENLAFAXINE HCL ER 75 MG PO CP24
75.0000 mg | ORAL_CAPSULE | Freq: Every day | ORAL | Status: DC
  Filled 2013-06-03: qty 1

## 2013-06-03 MED ORDER — HYDROMORPHONE HCL PF 1 MG/ML IJ SOLN
2.0000 mg | INTRAMUSCULAR | Status: DC | PRN
  Administered 2013-06-03: 1 mg via INTRAVENOUS
  Administered 2013-06-03: 2 mg via INTRAVENOUS
  Filled 2013-06-03 (×2): qty 2

## 2013-06-03 MED ORDER — SODIUM CHLORIDE 0.9 % IV SOLN
INTRAVENOUS | Status: DC
  Administered 2013-06-03 (×2): via INTRAVENOUS

## 2013-06-03 MED ORDER — IOHEXOL 300 MG/ML  SOLN
25.0000 mL | INTRAMUSCULAR | Status: DC | PRN
Start: 2013-06-03 — End: 2013-06-03

## 2013-06-03 MED ORDER — NICOTINE 21 MG/24HR TD PT24
21.0000 mg | MEDICATED_PATCH | Freq: Every day | TRANSDERMAL | Status: DC
  Administered 2013-06-03: 21 mg via TRANSDERMAL
  Filled 2013-06-03: qty 1

## 2013-06-03 MED ORDER — ONDANSETRON HCL 4 MG/2ML IJ SOLN
4.0000 mg | Freq: Once | INTRAMUSCULAR | Status: AC
  Administered 2013-06-03: 4 mg via INTRAVENOUS

## 2013-06-03 MED ORDER — SODIUM CHLORIDE 0.9 % IV SOLN
80.0000 mg | Freq: Once | INTRAVENOUS | Status: AC
  Administered 2013-06-03: 80 mg via INTRAVENOUS
  Filled 2013-06-03: qty 80

## 2013-06-03 MED ORDER — PROMETHAZINE HCL 25 MG/ML IJ SOLN
12.5000 mg | Freq: Once | INTRAMUSCULAR | Status: AC
  Administered 2013-06-03: 12.5 mg via INTRAVENOUS
  Filled 2013-06-03: qty 1

## 2013-06-03 MED ORDER — METRONIDAZOLE IN NACL 5-0.79 MG/ML-% IV SOLN
500.0000 mg | Freq: Three times a day (TID) | INTRAVENOUS | Status: DC
  Filled 2013-06-03: qty 100

## 2013-06-03 MED ORDER — PANTOPRAZOLE SODIUM 40 MG IV SOLR: 40.0000 mg | Freq: Once | INTRAVENOUS | Status: DC

## 2013-06-03 MED ORDER — ONDANSETRON HCL 4 MG/2ML IJ SOLN
4.0000 mg | Freq: Four times a day (QID) | INTRAMUSCULAR | Status: DC | PRN
  Administered 2013-06-03: 4 mg via INTRAVENOUS
  Filled 2013-06-03: qty 2

## 2013-06-03 MED ORDER — SODIUM CHLORIDE 0.9 % IV SOLN
8.0000 mg/h | INTRAVENOUS | Status: DC
  Administered 2013-06-03: 8 mg/h via INTRAVENOUS
  Filled 2013-06-03 (×3): qty 80

## 2013-06-03 MED ORDER — SODIUM CHLORIDE 0.9 % IJ SOLN: 3.0000 mL | Freq: Two times a day (BID) | INTRAMUSCULAR | Status: DC

## 2013-06-03 MED ORDER — VENLAFAXINE HCL ER 75 MG PO CP24
75.0000 mg | ORAL_CAPSULE | Freq: Every day | ORAL | Status: DC
  Administered 2013-06-03: 75 mg via ORAL
  Filled 2013-06-03 (×2): qty 1

## 2013-06-03 MED ORDER — ONDANSETRON HCL 4 MG/2ML IJ SOLN
INTRAMUSCULAR | Status: AC
  Filled 2013-06-03: qty 2

## 2013-06-03 MED ORDER — PIPERACILLIN-TAZOBACTAM 3.375 G IVPB 30 MIN
3.3750 g | Freq: Once | INTRAVENOUS | Status: AC
  Administered 2013-06-03: 3.375 g via INTRAVENOUS
  Filled 2013-06-03 (×2): qty 50

## 2013-06-03 MED ORDER — DEXTROSE 5 % IV SOLN
1.0000 g | Freq: Once | INTRAVENOUS | Status: AC
  Administered 2013-06-03: 1 g via INTRAVENOUS
  Filled 2013-06-03: qty 10

## 2013-06-03 MED ORDER — SODIUM CHLORIDE 0.9 % IV SOLN: 8.0000 mg/h | INTRAVENOUS | Status: AC

## 2013-06-03 MED ORDER — SODIUM CHLORIDE 0.9 % IV SOLN: 80.0000 mg | Freq: Once | INTRAVENOUS | Status: DC

## 2013-06-03 MED ORDER — NICOTINE 21 MG/24HR TD PT24: 21.0000 mg | MEDICATED_PATCH | Freq: Every day | TRANSDERMAL | Status: AC

## 2013-06-03 MED ORDER — IOHEXOL 300 MG/ML  SOLN
80.0000 mL | Freq: Once | INTRAMUSCULAR | Status: AC | PRN
  Administered 2013-06-03: 80 mL via INTRAVENOUS

## 2013-06-03 NOTE — Progress Notes (Signed)
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 Findings explain anemia, heme positive stool, abd pain and jaundice.  Management is an issue.  Endoscopy would be risky b/c it could blow open the confined perforation.  The gastroduodenal artery courses through the perforated area rendering percutaneous drainage higher risk for bleeding. It's probably best to transfer back to [facility] where she had her original surgery and where GI is following.

## 2013-06-03 NOTE — ED Notes (Signed)
Pt has signed consent form for blood transfusion. Pt verbally acknowledges and understands reason for the blood transfusion as well as the risks and benefits. Family at bedside and also acknowledge need, risks and benefits for the transfusion.

## 2013-06-03 NOTE — Progress Notes (Signed)
ANTIBIOTIC CONSULT NOTE - INITIAL  Pharmacy Consult for zosyn Indication: rule out sepsis  Allergies  Allergen Reactions  . Amitriptyline Other (See Comments)    Memory loss   . Codeine Other (See Comments)    Nausea and diarrhea   . Eggs Or Egg-Derived Products Nausea And Vomiting  . Metoprolol Other (See Comments)    fatigue and head ache   . Sulfa Antibiotics Other (See Comments)    Rash   . Verapamil Other (See Comments)    dizziness   Vital Signs: Temp: 97.6 F (36.4 C) (04/11 0806) Temp src: Oral (04/11 0806) BP: 100/73 mmHg (04/11 0806) Pulse Rate: 95 (04/11 0700) Intake/Output from previous day:   Intake/Output from this shift: Total I/O In: 12.5 [Blood:12.5] Out: -   Labs:  Recent Labs  06/03/13 0500 06/03/13 0502  WBC  --  13.1*  HGB 9.2* 8.0*  PLT  --  308  CREATININE 1.40* 1.16*   The CrCl is unknown because both a height and weight (above a minimum accepted value) are required for this calculation. No results found for this basename: VANCOTROUGH, VANCOPEAK, VANCORANDOM, GENTTROUGH, GENTPEAK, GENTRANDOM, TOBRATROUGH, TOBRAPEAK, TOBRARND, AMIKACINPEAK, AMIKACINTROU, AMIKACIN,  in the last 72 hours   Microbiology: No results found for this or any previous visit (from the past 720 hour(s)).  Medical History: Past Medical History  Diagnosis Date  . Cancer   . Ulcer   . GERD (gastroesophageal reflux disease)   . Thyroid disease   . Cystitis   . DDD (degenerative disc disease)   . COPD (chronic obstructive pulmonary disease)   . Chronic pain   . Hyperlipidemia     Medications:  See med rec   Assessment: Patient is a 68 y.o F presented to the ED for syncope.   She was hypotensive on admission.  Scr has decreased from 1.40 to 1.16.  To start zosyn for broad coverage for r/o sepsis.  Plan:  1) zosyn 3.375gm IV for 1 over 30 minutes, then 3.375gm IV q8h (infuse over 4 hours) 2) zosyn has anaerobic coverage-- consider d/c flagyl if  appropriate  Cassidey Barrales P Keion Neels 06/03/2013,8:33 AM

## 2013-06-03 NOTE — ED Provider Notes (Signed)
Review of chart shows a possible [facility] visit in March.   Dr Deatra Ina, [facility] given patient information, will consult.  Rolland Porter, MD, Abram Sander   Janice Norrie, MD 06/03/13 [phone]

## 2013-06-03 NOTE — ED Provider Notes (Signed)
CSN: [id number]     Arrival date & time 06/03/13  0422 History   First MD Initiated Contact with Patient 06/03/13 0431     Chief Complaint  Patient presents with  . Loss of Consciousness  . Emesis     (Consider location/radiation/quality/duration/timing/severity/associated sxs/prior Treatment) HPI This patient is a 68 year old woman with a history of esophageal cancer, gastroesophageal reflux disease, COPD and hyperlipidemia. She does not drink alcohol.  The patient presents from home via EMS after syncopal episode. She was found to be hypotensive on arrival to the emergency department with a systolic blood pressure in the 60s. Initially, history was obtained from the patient's sister. She says the patient has been feeling unwell for about 6 weeks. She's been having epigastric pain. She's been evaluated with an ultrasound of the right upper quadrant as well as CT of the abdomen and pelvis. According to the sister, both of the studies were nondiagnostic and was talkative her for for HIDA scan.   The patient has had diminished by mouth intake with nausea and vomiting over the last few days. The sister reports that she believes the patient got up to go the bathroom during the middle of the night. The sister and her husband heard the patient fall in the hallway. The patient was unresponsive for a minute or 2 when they arrived. She was flaccid. She is not incontinent.   The patient is noted to be jaundiced. Sister says that she first noticed this over the past couple of days. The patient has no history of liver disease.  Denies acetominophen ingestion.     Past Medical History  Diagnosis Date  . Cancer   . Ulcer   . GERD (gastroesophageal reflux disease)   . Thyroid disease   . Cystitis   . DDD (degenerative disc disease)   . COPD (chronic obstructive pulmonary disease)   . Chronic pain   . Hyperlipidemia    Past Surgical History  Procedure Laterality Date  . Eye surgery     Family  History  Problem Relation Age of Onset  . Cancer Sister    History  Substance Use Topics  . Smoking status: Current Every Day Smoker -- 1.00 packs/day    Types: Cigarettes  . Smokeless tobacco: Not on file  . Alcohol Use: No   OB History   Grav Para Term Preterm Abortions TAB SAB Ect Mult Living                 Review of Systems Ten point review of symptoms performed and is negative with the exception of symptoms noted above.   Allergies  Amitriptyline; Codeine; Eggs or egg-derived products; Metoprolol; Sulfa antibiotics; and Verapamil  Home Medications   Current Outpatient Rx  Name  Route  Sig  Dispense  Refill  . aspirin 81 MG tablet   Oral   Take 81 mg by mouth daily.         . cyclobenzaprine (FLEXERIL) 10 MG tablet   Oral   Take 10 mg by mouth daily as needed for muscle spasms.         Marland Kitchen esomeprazole (NEXIUM) 40 MG capsule   Oral   Take 40 mg by mouth 2 (two) times daily before a meal.          . fluticasone (FLONASE) 50 MCG/ACT nasal spray   Each Nare   Place 2 sprays into both nostrils daily.          Marland Kitchen ibuprofen (ADVIL,MOTRIN) 200  MG tablet   Oral   Take 400 mg by mouth every 6 (six) hours as needed for moderate pain.         Marland Kitchen lactulose (CHRONULAC) 10 GM/15ML solution   Oral   Take 10 g by mouth 2 (two) times daily as needed for mild constipation.         Marland Kitchen levothyroxine (SYNTHROID, LEVOTHROID) 100 MCG tablet   Oral   Take 100 mcg by mouth daily before breakfast.         . lidocaine (LIDODERM) 5 %   Transdermal   Place 1 patch onto the skin daily as needed (Back Pain). Remove & Discard patch within 12 hours or as directed by MD         . ondansetron (ZOFRAN-ODT) 8 MG disintegrating tablet   Oral   Take 8 mg by mouth every 8 (eight) hours as needed for nausea or vomiting.         Marland Kitchen oxyCODONE-acetaminophen (PERCOCET/ROXICET) 5-325 MG per tablet   Oral   Take 1 tablet by mouth 3 (three) times daily as needed for moderate pain or  severe pain.          . polyethylene glycol (MIRALAX / GLYCOLAX) packet   Oral   Take 17 g by mouth daily as needed for mild constipation.          . pravastatin (PRAVACHOL) 40 MG tablet   Oral   Take 40 mg by mouth daily.         . promethazine (PHENERGAN) 25 MG suppository   Rectal   Place 1 suppository (25 mg total) rectally every 6 (six) hours as needed for nausea or vomiting.   20 each   0   . traMADol (ULTRAM) 50 MG tablet   Oral   Take 1 tablet (50 mg total) by mouth every 6 (six) hours as needed.   20 tablet   0   . venlafaxine XR (EFFEXOR-XR) 75 MG 24 hr capsule   Oral   Take 75 mg by mouth daily with breakfast.         . VITAMIN D, CHOLECALCIFEROL, PO   Oral   Take 1 tablet by mouth daily.          BP 104/56  Pulse 93  Temp(Src) 97.6 F (36.4 C) (Rectal)  Resp 12  SpO2 100% Physical Exam  Gen: disheveled, ill appearing, somnolent but arousable Head: NCAT Eyes: PERL, right eye does not deviate to the midline, otherwise EOMI, sclera are icteric Nose: no epistaixis or rhinorrhea Mouth/throat: mucosa is moist and pink, edentulous, frothy coffee ground appearing sputum - small amount.  Neck: supple, no stridor Lungs: CTA B, no wheezing, rhonchi or rales CV: regular rate and rythm, thready radial pulses bilaterally.  Abd: soft, tender over the midline epigastrium diffusetly, nondistended Back: no ttp, no cva ttp Skin: jaundice Ext: no edema, normal to inspection Neuro: CN ii-xii grossly intact, no focal deficits Psyche; normal affect,  calm and cooperative. ED Course  Procedures (including critical care time) Labs Review Labs Reviewed  COMPREHENSIVE METABOLIC PANEL - Abnormal; Notable for the following:    CO2 17 (*)    Glucose, Bld 134 (*)    BUN 25 (*)    Creatinine, Ser 1.16 (*)    Calcium 7.5 (*)    Total Protein 5.3 (*)    Albumin 2.1 (*)    AST 112 (*)    ALT 172 (*)    Alkaline Phosphatase 246 (*)  Total Bilirubin 6.1 (*)     GFR calc non Af Amer 48 (*)    GFR calc Af Amer 55 (*)    All other components within normal limits  CBC WITH DIFFERENTIAL - Abnormal; Notable for the following:    WBC 13.1 (*)    RBC 2.48 (*)    Hemoglobin 8.0 (*)    HCT 24.5 (*)    Neutrophils Relative % 80 (*)    Neutro Abs 10.4 (*)    Lymphocytes Relative 11 (*)    All other components within normal limits  LIPASE, BLOOD - Abnormal; Notable for the following:    Lipase 187 (*)    All other components within normal limits  URINALYSIS, ROUTINE W REFLEX MICROSCOPIC - Abnormal; Notable for the following:    Color, Urine AMBER (*)    APPearance CLOUDY (*)    Hgb urine dipstick SMALL (*)    Bilirubin Urine LARGE (*)    Leukocytes, UA TRACE (*)    All other components within normal limits  PROTIME-INR - Abnormal; Notable for the following:    Prothrombin Time 15.5 (*)    All other components within normal limits  APTT - Abnormal; Notable for the following:    aPTT 23 (*)    All other components within normal limits  URINE MICROSCOPIC-ADD ON - Abnormal; Notable for the following:    Squamous Epithelial / LPF MANY (*)    Bacteria, UA FEW (*)    All other components within normal limits  CBG MONITORING, ED - Abnormal; Notable for the following:    Glucose-Capillary 116 (*)    All other components within normal limits  I-STAT CHEM 8, ED - Abnormal; Notable for the following:    BUN 24 (*)    Creatinine, Ser 1.40 (*)    Glucose, Bld 145 (*)    Calcium, Ion 1.02 (*)    Hemoglobin 9.2 (*)    HCT 27.0 (*)    All other components within normal limits  CULTURE, BLOOD (ROUTINE X 2)  CULTURE, BLOOD (ROUTINE X 2)  AMMONIA  HEPATITIS PANEL, ACUTE  ACETAMINOPHEN LEVEL  LACTATE DEHYDROGENASE  I-STAT TROPOININ, ED  TYPE AND SCREEN   Imaging Review Dg Chest Port 1 View  06/03/2013   CLINICAL DATA:  Mental status changes.  EXAM: PORTABLE CHEST - 1 VIEW  COMPARISON:  05/14/2013  FINDINGS: Postoperative changes in the mediastinum.  Shallow inspiration. Blunting of right costophrenic angle suggesting fluid or thickened pleura. This has been present previously. No focal airspace disease or consolidation.  IMPRESSION: Stable appearance of the chest since previous study.   Electronically Signed   By: Lucienne Capers M.D.   On: 06/03/2013 05:26    EKG: nsr, no acute ischemic changes, normal intervals, normal axis, normal qrs complex  CRITICAL CARE Performed by: Elyn Peers   Total critical care time: 45m  Critical care time was exclusive of separately billable procedures and treating other patients.  Critical care was necessary to treat or prevent imminent or life-threatening deterioration.  Critical care was time spent personally by me on the following activities: development of treatment plan with patient and/or surrogate as well as nursing, discussions with consultants, evaluation of patient's response to treatment, examination of patient, obtaining history from patient or surrogate, ordering and performing treatments and interventions, ordering and review of laboratory studies, ordering and review of radiographic studies, pulse oximetry and re-evaluation of patient's condition.   MDM   Patient presents with hypotension and syncope. Suspect  that hypotension is secondary to volume depleted state. Good response to IVF resuscitation. Septic shock remains a concern as does acute GI bleeding. We have typed and screened. CXR is unremarkable.   Labs notable for obstructive jaundice pattern and metabolic acidosis and acute anemia with a hemoglobin of 8.0 down from 12.5 just two weeks ago. Will check LDH level to help rule out hemolysis as cause. However, the patient clearly has some GI bleeding and this is the most likely cause.   We will type and cross and transfuse 1 unit of PRBCs.  The patient had an unremarkable [place] just two weeks ago. Will repeat today but, believe this study will be low yield.   Plan to admit to [facility].       Elyn Peers, MD 06/05/13 [phone]

## 2013-06-03 NOTE — Consult Note (Signed)
GI Consultation  Referring Provider: No ref. provider found Primary Care Physician:  PROVIDER NOT IN SYSTEM Primary Gastroenterologist:  Dr.  Luiz Iron for Consultation:  *anemia, jaundice**  HPI: Makayla Thompson is a 68 y.o. female *admitted with syncope.  Patient has history of esophageal cancer diagnosed and resected about 4 years ago at [facility].  This was followed by chemotherapy and radiation therapy.  The past month she has been complaining of chronic upper abdominal pain with radiation to the back.  She's had 2 ER visits*where CT and ultrasound and a serrated possible gallbladder sludge but no other significant abnormalities.  She was scheduled for an upper endoscopy at [facility].  He takes ibuprofen at least 3 times a day.  Stools recently have been  light in color but remains solid.  In the ER she tested Hemoccult positive*with a hemoglobin of 8.0.  3 weeks ago hemoglobin was 12.5.  She takes Nexium.   Past Medical History  Diagnosis Date  . Cancer   . Ulcer   . GERD (gastroesophageal reflux disease)   . Thyroid disease   . Cystitis   . DDD (degenerative disc disease)   . COPD (chronic obstructive pulmonary disease)   . Chronic pain   . Hyperlipidemia     Past Surgical History  Procedure Laterality Date  . Eye surgery      Prior to Admission medications   Medication Sig Start Date End Date Taking? Authorizing Provider  aspirin 81 MG tablet Take 81 mg by mouth daily.   Yes Historical Provider, MD  cyclobenzaprine (FLEXERIL) 10 MG tablet Take 10 mg by mouth daily as needed for muscle spasms.   Yes Historical Provider, MD  esomeprazole (NEXIUM) 40 MG capsule Take 40 mg by mouth 2 (two) times daily before a meal.    Yes Historical Provider, MD  fluticasone (FLONASE) 50 MCG/ACT nasal spray Place 2 sprays into both nostrils daily.    Yes Historical Provider, MD  ibuprofen (ADVIL,MOTRIN) 200 MG tablet Take 400 mg by mouth every 6 (six) hours as needed for moderate pain.   Yes  Historical Provider, MD  lactulose (CHRONULAC) 10 GM/15ML solution Take 10 g by mouth 2 (two) times daily as needed for mild constipation.   Yes Historical Provider, MD  levothyroxine (SYNTHROID, LEVOTHROID) 100 MCG tablet Take 100 mcg by mouth daily before breakfast.   Yes Historical Provider, MD  lidocaine (LIDODERM) 5 % Place 1 patch onto the skin daily as needed (Back Pain). Remove & Discard patch within 12 hours or as directed by MD   Yes Historical Provider, MD  ondansetron (ZOFRAN-ODT) 8 MG disintegrating tablet Take 8 mg by mouth every 8 (eight) hours as needed for nausea or vomiting.   Yes Historical Provider, MD  oxyCODONE-acetaminophen (PERCOCET/ROXICET) 5-325 MG per tablet Take 1 tablet by mouth 3 (three) times daily as needed for moderate pain or severe pain.    Yes Historical Provider, MD  polyethylene glycol (MIRALAX / GLYCOLAX) packet Take 17 g by mouth daily as needed for mild constipation.    Yes Historical Provider, MD  pravastatin (PRAVACHOL) 40 MG tablet Take 40 mg by mouth daily.   Yes Historical Provider, MD  promethazine (PHENERGAN) 25 MG suppository Place 1 suppository (25 mg total) rectally every 6 (six) hours as needed for nausea or vomiting. 05/14/13  Yes Lauren Burnetta Sabin, PA-C  traMADol (ULTRAM) 50 MG tablet Take 1 tablet (50 mg total) by mouth every 6 (six) hours  as needed. 05/14/13  Yes Lauren Burnetta Sabin, PA-C  venlafaxine XR (EFFEXOR-XR) 75 MG 24 hr capsule Take 75 mg by mouth daily with breakfast.   Yes Historical Provider, MD  VITAMIN D, CHOLECALCIFEROL, PO Take 1 tablet by mouth daily.   Yes Historical Provider, MD    Current Facility-Administered Medications  Medication Dose Route Frequency Provider Last Rate Last Dose  . 0.9 %  sodium chloride infusion   Intravenous Continuous Nishant Dhungel, MD 125 mL/hr at 06/03/13 1020    . cefTRIAXone (ROCEPHIN) 1 g in dextrose 5 % 50 mL IVPB  1 g Intravenous Once Elyn Peers, MD   1 g at 06/03/13 1121  . fluticasone (FLONASE)  50 MCG/ACT nasal spray 2 spray  2 spray Each Nare Daily Nishant Dhungel, MD      . HYDROmorphone (DILAUDID) injection 2 mg  2 mg Intravenous Q4H PRN Nishant Dhungel, MD   2 mg at 06/03/13 1013  . iohexol (OMNIPAQUE) 300 MG/ML solution 25 mL  25 mL Oral PRN Medication Radiologist, MD      . Derrill Memo ON 06/04/2013] levothyroxine (SYNTHROID, LEVOTHROID) tablet 100 mcg  100 mcg Oral QAC breakfast Nishant Dhungel, MD      . nicotine (NICODERM CQ - dosed in mg/24 hours) patch 21 mg  21 mg Transdermal Daily Nishant Dhungel, MD      . ondansetron (ZOFRAN) tablet 4 mg  4 mg Oral Q6H PRN Nishant Dhungel, MD       Or  . ondansetron (ZOFRAN) injection 4 mg  4 mg Intravenous Q6H PRN Nishant Dhungel, MD   4 mg at 06/03/13 1016  . pantoprazole (PROTONIX) 80 mg in sodium chloride 0.9 % 250 mL infusion  8 mg/hr Intravenous Continuous Nishant Dhungel, MD      . piperacillin-tazobactam (ZOSYN) IVPB 3.375 g  3.375 g Intravenous Once [employer], RPH      . piperacillin-tazobactam (ZOSYN) IVPB 3.375 g  3.375 g Intravenous Q8H Anh P Pham, RPH      . sodium chloride 0.9 % injection 3 mL  3 mL Intravenous Q12H Nishant Dhungel, MD      . venlafaxine XR (EFFEXOR-XR) 24 hr capsule 75 mg  75 mg Oral Q breakfast Nishant Dhungel, MD        Allergies as of 06/03/2013 - Review Complete 06/03/2013  Allergen Reaction Noted  . Amitriptyline Other (See Comments) 04/27/2013  . Codeine Other (See Comments) 04/27/2013  . Eggs or egg-derived products Nausea And Vomiting 04/27/2013  . Metoprolol Other (See Comments) 04/27/2013  . Sulfa antibiotics Other (See Comments) 04/27/2013  . Verapamil Other (See Comments) 04/27/2013    Family History  Problem Relation Age of Onset  . Cancer Sister     History   Social History  . Marital Status: Widowed    Spouse Name: N/A    Number of Children: N/A  . Years of Education: N/A   Occupational History  . Not on file.   Social History Main Topics  . Smoking status: Current Every Day  Smoker -- 1.00 packs/day    Types: Cigarettes  . Smokeless tobacco: Not on file  . Alcohol Use: No  . Drug Use: No  . Sexual Activity: Not on file   Other Topics Concern  . Not on file   Social History Narrative  . No narrative on file    Review of Systems: Pertinent positive and negative review of systems were noted in the above history of present illness section. All other review  of systems were otherwise negative   Physical Exam: Vital signs in last 24 hours: Temp:  [97.4 F (36.3 C)-97.7 F (36.5 C)] 97.7 F (36.5 C) (04/11 0915) Pulse Rate:  [61-120] 99 (04/11 1100) Resp:  [8-24] 8 (04/11 1100) BP: (66-128)/(41-73) 101/52 mmHg (04/11 1100) SpO2:  [95 %-100 %] 95 % (04/11 1100) Weight:  [124 lb (56.246 kg)] 124 lb (56.246 kg) (04/11 0915)  General:   Alert,  Well-developed, well-nourished, pleasant and cooperative in NAD Head:  Normocephalic and atraumatic. Eyes:  Anicteric sclera;  Conjunctiva pink. Ears:  Normal auditory acuity. Nose:  No deformity, discharge,  or lesions. Mouth:  No deformity or lesions.  Oropharynx pink & moist. Neck:  Supple; no masses or thyromegaly. Lungs:  Clear throughout to auscultation.   No wheezes, crackles, or rhonchi. No acute distress. Heart:  Regular rate and rhythm; no murmurs, clicks, rubs,  or gallops. Abdomen:  Soft, nontender and nondistended. No masses, hepatosplenomegaly or hernias noted. Normal bowel sounds, without guarding, and without rebound.   Rectal:  Deferred   Msk:  Symmetrical without gross deformities. Normal posture. Pulses:  Normal pulses noted. Extremities:  Without clubbing or edema. Neurologic:  Alert and  oriented x4;  grossly normal neurologically. Skin:  Intact without significant lesions or rashes.  Jaundice Cervical Nodes:  No significant cervical adenopathy. Psych:  Alert and cooperative. Normal mood and affect.  Intake/Output from previous day:   Intake/Output this shift: Total I/O In: 12.5  [Blood:12.5] Out: -   Lab Results:  Recent Labs  06/03/13 0500 06/03/13 0502  WBC  --  13.1*  HGB 9.2* 8.0*  HCT 27.0* 24.5*  PLT  --  308   BMET  Recent Labs  06/03/13 0500 06/03/13 0502  NA 140 140  K 3.8 3.8  CL 108 105  CO2  --  17*  GLUCOSE 145* 134*  BUN 24* 25*  CREATININE 1.40* 1.16*  CALCIUM  --  7.5*   LFT  Recent Labs  06/03/13 0502  PROT 5.3*  ALBUMIN 2.1*  AST 112*  ALT 172*  ALKPHOS 246*  BILITOT 6.1*   PT/INR  Recent Labs  06/03/13 0527  LABPROT 15.5*  INR 1.26   Hepatitis Panel No results found for this basename: HEPBSAG, HCVAB, HEPAIGM, HEPBIGM,  in the last 72 hours Additional Labs *Lipase 187**  Studies/Results: Dg Chest Port 1 View  06/03/2013   CLINICAL DATA:  Mental status changes.  EXAM: PORTABLE CHEST - 1 VIEW  COMPARISON:  05/14/2013  FINDINGS: Postoperative changes in the mediastinum. Shallow inspiration. Blunting of right costophrenic angle suggesting fluid or thickened pleura. This has been present previously. No focal airspace disease or consolidation.  IMPRESSION: Stable appearance of the chest since previous study.   Electronically Signed   By: Lucienne Capers M.D.   On: 06/03/2013 05:26    Principal Problem:   GI bleed Active Problems:   COPD (chronic obstructive pulmonary disease)   GERD (gastroesophageal reflux disease)   Obstructive jaundice   Syncope and collapse   Hypotension   UTI (urinary tract infection)   History of esophageal cancer   Acute kidney injury   Metabolic acidosis   Dehydration   Tobacco abuse   Hematemesis   Sepsis   Recommendations -  1.  PPI therapy 2.  upper endoscopy-scheduled for a.m. 3.  repeat CT of the abdomen 4.  follow lipase and check amylase**  Assessment -  1.  *anemia, Hemoccult-positive stool-patient has been taking ibuprofen regularly and may  have chronic bleeding from active peptic disease.  She's had a significant drop in hemoglobin over the past 3 weeks. 2.   jaundice.  Scans including CT and ultrasound were negative for biliary tract disease or metastatic disease, in the setting of abdominal pain.  With new-onset jaundice I suspect that she may be obstructed.  This could be do to stones, malignant stricture or infiltrative liver disease from tumor 3.  abdominal pain - this could be related to pancreatitis.  Pain from other tract disease is also a consideration. 4.  syncope-likely related to hypotension and anemia 5.  history of esophageal CA 6.  COPD**       Garyn Arlotta D. Deatra Ina, MD, [facility] [phone]    06/03/2013, 11:26 AM

## 2013-06-03 NOTE — H&P (Addendum)
[facility] History and Physical  Makayla Thompson VQM:[id number] DOB: Apr 07, 1945 DOA: 06/03/2013  Referring physician: Dr Cheri Guppy PCP: Dr Wallace Cull[place] in [place]  Chief Complaint:  Syncope x 1 day  HPI:  68 year old female with past medical history of COPD, active tobacco use, GERD history of gastric ulcers seen on EGD 4 years ago and diagnosed off esophageal cancer at the same time status post surgery with? Tumor Resection and Gastroesophageal anastomosis followed by chemotherapy and radiation and clinically in remission (follows with oncologist at [facility] every 6 months), hypothyroidism, chronic pain who was brought to the ED after having a syncopal episode early this morning. Patient has been having abdominal pain for past one month and has had 2 visits to the ED in the past 5 weeks with workup including labs and CT scan of the abdomen was unremarkable. She was complaining of  Ongoing constipation and was treated with aggressive bowel regimen. She presented to the ED 2 weeks later with similar symptoms without significant findings. She was seen by surgery Dr. Donnajean Lopes is and thought this was related to constipation.  Patient reports that for past few weeks she has poor appetite and has lost almost 15 pounds in the past 2 months. She also reports ongoing abdominal pain mainly in the epigastric and right upper quadrant area radiating to the back . She has been nauseous and vomiting for off and on for these few weeks worsened for past 2 weeks. Patient's sister at bedside denies any change in her alertness or level of consciousness however she has been quite weak lately. Patient's niece noticed her to be jaundiced about 2 days back. Patient denies any dark stools (reports her stools to be pale for past one week and the last bowel movement was 3 days ago) . Early this morning at around 3:45 AM she got up to go to the bathroom and family heard her morning and when they got to the bathroom she was on  the floor where she had passed out. Eyes were ruled out and patient was voiding and poorly responding. She reports that she was dizzy prior to the syncopal episode and fishlike she was going to pass out. She did not remember hitting her head to any object. Sister informs that patient was poorly responsive for almost 10 minutes and EMS was called. Patient denies fever, chills,, chest pain, palpitations, SOB,  or urinary symptoms. Denies sustaining any injury from the fall.    Course in the ED Patient was afebrile, tachycardic to 110, respiratory rate of 24 and blood pressure of 66/42. She was given IV fluids (not sure how much) high which her blood pressure improved to 117/59 mmHg. She was found to be grossly jaundiced. Blood work done showed WBC of 13.1, hemoglobin of 8 and hematocrit of 24.5. Her hemoglobin was 12.5 about 3 weeks back platelets was 308. Stool for occult blood checked in the ED was positive. Chemistry showed sodium of 140, potassium 3.8, chloride of 105, bicarbonate 17, BUN of 25 and creatinine of 1.16.calcium of 7.5. Patient had an anion gap of 18. Lactic acid was elevated to 2.77. LFTs were deranged with total bilirubin of 6.1, AST of 112, ALT of 172 and alkaline phosphatase of 246. She had normal LFTs 3 weeks back. INR was normal. LDH and fibrinogen were elevated. Ammonia level is normal. UA was suggestive of UTI.  [facility] called for admission to step unit for sepsis.    Review of Systems: (Possible symptoms in bold)  Constitutional: Denies fever, chills, diaphoresis, appetite change and fatigue.  HEENT: Denies photophobia, eye pain, redness, hearing loss, ear pain, congestion, sore throat, rhinorrhea, sneezing, mouth sores, trouble swallowing, neck pain, neck stiffness    Respiratory: Denies SOB, DOE, cough, chest tightness,  and wheezing.   Cardiovascular: Denies chest pain, palpitations and leg swelling.  Gastrointestinal: Positive for nausea, vomiting, abdominal  pain, constipation, hematemesis Denies diarrhea,  blood in stool and abdominal distention.  Genitourinary: Denies dysuria, urgency, frequency, hematuria, flank pain and difficulty urinating.  Endocrine: Denies: hot or cold intolerance, s polyuria, polydipsia. Musculoskeletal: Denies myalgias, back pain, joint swelling, arthralgias and gait problem.  Skin: Denies pallor, rash and wound.  Neurological: Syncope, lightheadedness, weakness and headaches, Denies dizziness, seizures,  numbness  Hematological: Denies adenopathy. Easy bruising Psychiatric/Behavioral: Denies confusion, nervousness, sleep disturbance and agitation   Past Medical History  Diagnosis Date  . Cancer   . Ulcer   . GERD (gastroesophageal reflux disease)   . Thyroid disease   . Cystitis   . DDD (degenerative disc disease)   . COPD (chronic obstructive pulmonary disease)   . Chronic pain   . Hyperlipidemia    Past Surgical History  Procedure Laterality Date  . Eye surgery     Social History:  reports that she has been smoking Cigarettes.  She has been smoking about 1.00 pack per day. She does not have any smokeless tobacco history on file. She reports that she does not drink alcohol or use illicit drugs.  Allergies  Allergen Reactions  . Amitriptyline Other (See Comments)    Memory loss   . Codeine Other (See Comments)    Nausea and diarrhea   . Eggs Or Egg-Derived Products Nausea And Vomiting  . Metoprolol Other (See Comments)    fatigue and head ache   . Sulfa Antibiotics Other (See Comments)    Rash   . Verapamil Other (See Comments)    dizziness    Family History  Problem Relation Age of Onset  . Cancer Sister     Prior to Admission medications   Medication Sig Start Date End Date Taking? Authorizing Provider  aspirin 81 MG tablet Take 81 mg by mouth daily.   Yes Historical Provider, MD  cyclobenzaprine (FLEXERIL) 10 MG tablet Take 10 mg by mouth daily as needed for muscle spasms.   Yes Historical  Provider, MD  esomeprazole (NEXIUM) 40 MG capsule Take 40 mg by mouth 2 (two) times daily before a meal.    Yes Historical Provider, MD  fluticasone (FLONASE) 50 MCG/ACT nasal spray Place 2 sprays into both nostrils daily.    Yes Historical Provider, MD  ibuprofen (ADVIL,MOTRIN) 200 MG tablet Take 400 mg by mouth every 6 (six) hours as needed for moderate pain.   Yes Historical Provider, MD  lactulose (CHRONULAC) 10 GM/15ML solution Take 10 g by mouth 2 (two) times daily as needed for mild constipation.   Yes Historical Provider, MD  levothyroxine (SYNTHROID, LEVOTHROID) 100 MCG tablet Take 100 mcg by mouth daily before breakfast.   Yes Historical Provider, MD  lidocaine (LIDODERM) 5 % Place 1 patch onto the skin daily as needed (Back Pain). Remove & Discard patch within 12 hours or as directed by MD   Yes Historical Provider, MD  ondansetron (ZOFRAN-ODT) 8 MG disintegrating tablet Take 8 mg by mouth every 8 (eight) hours as needed for nausea or vomiting.   Yes Historical Provider, MD  oxyCODONE-acetaminophen (PERCOCET/ROXICET) 5-325 MG per tablet Take 1 tablet by mouth  3 (three) times daily as needed for moderate pain or severe pain.    Yes Historical Provider, MD  polyethylene glycol (MIRALAX / GLYCOLAX) packet Take 17 g by mouth daily as needed for mild constipation.    Yes Historical Provider, MD  pravastatin (PRAVACHOL) 40 MG tablet Take 40 mg by mouth daily.   Yes Historical Provider, MD  promethazine (PHENERGAN) 25 MG suppository Place 1 suppository (25 mg total) rectally every 6 (six) hours as needed for nausea or vomiting. 05/14/13  Yes Lauren Burnetta Sabin, PA-C  traMADol (ULTRAM) 50 MG tablet Take 1 tablet (50 mg total) by mouth every 6 (six) hours as needed. 05/14/13  Yes Lauren Burnetta Sabin, PA-C  venlafaxine XR (EFFEXOR-XR) 75 MG 24 hr capsule Take 75 mg by mouth daily with breakfast.   Yes Historical Provider, MD  VITAMIN D, CHOLECALCIFEROL, PO Take 1 tablet by mouth daily.   Yes Historical  Provider, MD     Physical Exam:  Filed Vitals:   06/03/13 0718 06/03/13 0730 06/03/13 0747 06/03/13 0806  BP: 115/63 116/54  100/73  Pulse:      Temp:   97.4 F (36.3 C) 97.6 F (36.4 C)  TempSrc:   Oral Oral  Resp: 16 17  15   SpO2: 97% 95% 100% 100%    Constitutional: Vital signs reviewed.  Patient is thin built elderly female lying in bed appears pale and jaundiced HEENT: Pallor present , icteric,  dry oral mucosa,  no cervical lymphadenopathy Cardiovascular: RRR, S1 normal, S2 normal, no MRG Chest: CTAB, no wheezes, rales, or rhonchi Abdominal: Soft., non-distended, bowel sounds are normal, tender to palpation over epigastric and right upper quadrant area, no masses, organomegaly appreciated Ext: warm, no edema, skin grossly icteric  Neurological: A&O x3, non focal  Labs on Admission:  Basic Metabolic Panel:  Recent Labs Lab 06/03/13 0500 06/03/13 0502  NA 140 140  K 3.8 3.8  CL 108 105  CO2  --  17*  GLUCOSE 145* 134*  BUN 24* 25*  CREATININE 1.40* 1.16*  CALCIUM  --  7.5*   Liver Function Tests:  Recent Labs Lab 06/03/13 0502  AST 112*  ALT 172*  ALKPHOS 246*  BILITOT 6.1*  PROT 5.3*  ALBUMIN 2.1*    Recent Labs Lab 06/03/13 0502  LIPASE 187*    Recent Labs Lab 06/03/13 0527  AMMONIA RESULTS UNAVAILABLE DUE TO INTERFERING SUBSTANCE   CBC:  Recent Labs Lab 06/03/13 0500 06/03/13 0502  WBC  --  13.1*  NEUTROABS  --  10.4*  HGB 9.2* 8.0*  HCT 27.0* 24.5*  MCV  --  98.8  PLT  --  308   Cardiac Enzymes: No results found for this basename: CKTOTAL, CKMB, CKMBINDEX, TROPONINI,  in the last 168 hours BNP: No components found with this basename: POCBNP,  CBG:  Recent Labs Lab 06/03/13 0507  GLUCAP 116*    Radiological Exams on Admission: Dg Chest Port 1 View  06/03/2013   CLINICAL DATA:  Mental status changes.  EXAM: PORTABLE CHEST - 1 VIEW  COMPARISON:  05/14/2013  FINDINGS: Postoperative changes in the mediastinum. Shallow  inspiration. Blunting of right costophrenic angle suggesting fluid or thickened pleura. This has been present previously. No focal airspace disease or consolidation.  IMPRESSION: Stable appearance of the chest since previous study.   Electronically Signed   By: Lucienne Capers M.D.   On: 06/03/2013 05:26    EKG: Sinus tachycardia at 106, no ST-T changes  Assessment/Plan  Principal Problem:  Sepsis Admit to stepdown  patient hypotensive and tachycardic possibly due to underling GI bleed and dehydration Given IV fluid in ED within improvement in BP. Continue IV NS @125  cc/ hr.  Blood cx and urine cx sent from ED. UA suggestive of UTI. Elevated lactic acid to 2.77 Place on empiric zosyn and flagyl given concern for cholecystitis and intraabdominal infection.   Active Problems: Acute Anemia  noted for drop in 4.5 gm hb in 3 weeks. Likely secondary to upper GI bleed. patient has coffee ground emesis in ED. Patient has hx of gastric ulcer with ?esophagogastric bypass for her esophageal ca about 4 yeas back. She continues to smoke 1PPD and also reports taking 3-4 motrin daily for abdominal pain.  Stool for occult blood positive per EDP. Keep NPO. Start on PPI drip. Monitor H&H q 6hrs  getting 1 U PRBC in ED. GI consult called.     Obstructive jaundice Differential includes obstructive stone vs stricture vs malignancy Keep NPO -Check Tylenol level, ANA and hepatitis panel. Monitor LFTs closely.  last [place] abd 3 weeks ago showed biliary sludge. patient had abdominal pain for past 1 months an is tender on exam over epigastric and RUQ area. started empiric zosyn and flagyl. Check CT and and pelvis with contrast. Also has mild elevation of lipase which could be from dehydration. Will follow with GI recommendations.   Syncope and collapse Possibly due to dehydration and orthostasis. SBP in 60s upon arrival. Sister informs patient was unresponsive for almost 10 minutes. No witnessed seizure. Unsure of  ehad injury. Will obtain head CT. Monitor on tele.    Hypotension secondary to dehydration. Monitor with fluids    UTI (urinary tract infection) On empiric zosyn. Follow cx    Acute kidney injury Likely prerenal. Monitor with fluids.     Metabolic acidosis Secondary to dehydration and vomiting. Monitor with fluids     History of esophageal cancer  S/p surgery with ? Gastroesophageal anastomosis, followed by chemo and radiation 4 years back at [place]. Now in remission and follows with oncology every 6 months.    COPD (chronic obstructive pulmonary disease) Continue home inhaler. Counseled on smoking cessation. o2 via [place] prn    GERD (gastroesophageal reflux disease) On PPI at home. Heavy smoker with hx of gastric ulcer. EGD 4 years back. Start on PPI drip. ( IV team paged for second  IV access)      Tobacco abuse counseled on cessation. Will order nicotine patch  hypothyroidism  resume synthroid once taking po     Diet:NPO  DVT prophylaxis: sSCD   Code Status: full code Family Communication: discussed with sister at bedside Disposition Plan: admit to [place] [facility] Pager [phone]  Total time spent on admission :70 minutes  If 7PM-7AM, please contact night-coverage [email or web address] Password [email or web address] 06/03/2013, 8:21 AM

## 2013-06-03 NOTE — ED Notes (Signed)
Portable xray at bedside.

## 2013-06-03 NOTE — Discharge Summary (Addendum)
Physician Discharge Summary  Makayla Thompson [id number] DOB: April 25, 1945 DOA: 06/03/2013  PCP: Wallace Cullens, MD Admit date: 06/03/2013 Discharge date: 06/03/2013  Time spent: 40 minutes  Recommendations for Outpatient Follow-up:  1. Discharge to [place] [facility] for further care   Accepting physician : Dr Rutherford Guys, surgeon at [facility]  transfer unit: SICU  Discharge Diagnoses:  Principal Problem:   Perforated duodenal ulcer with hemorrhage  Active Problems:   GI bleed   Obstructive jaundice   COPD (chronic obstructive pulmonary disease)   GERD (gastroesophageal reflux disease)   Syncope and collapse   Hypotension   UTI (urinary tract infection)   History of esophageal cancer   Acute kidney injury   Metabolic acidosis   Dehydration   Tobacco abuse   Hematemesis   Sepsis   Discharge Condition: guarded  Diet recommendation: NPO  Filed Weights   06/03/13 0915  Weight: 56.246 kg (124 lb)    History of present illness:  68 year old female with past medical history of COPD, active tobacco use, GERD history of gastric ulcers seen on EGD 4 years ago and diagnosed off esophageal cancer at the same time status post surgery with an esophagogastroduodenoscopy with right thoracoscopic mobilization, [place] esophagogastrectomy with feeding jejunostomy tube placement followed by chemotherapy and radiation and clinically in remission (follows with oncologist at [facility] every 6 months), hypothyroidism, chronic pain who was brought to the ED after having a syncopal episode early this morning. Patient has been having abdominal pain for past one month and has had 2 visits to the ED in the past 5 weeks with workup including labs and CT scan of the abdomen was unremarkable. She was complaining of Ongoing constipation and was treated with aggressive bowel regimen. She presented to the ED 2 weeks later with similar symptoms without significant findings. She was seen by surgery Dr. Donnajean Lopes is and  thought this was related to constipation.  Patient reports that for past few weeks she has poor appetite and has lost almost 15 pounds in the past 2 months. She also reports ongoing abdominal pain mainly in the epigastric and right upper quadrant area radiating to the back . She has been nauseous and vomiting  off and on for these few weeks worsened for past 2 weeks. Patient's sister at bedside denies any change in her alertness or level of consciousness however she has been quite weak lately. Patient's niece noticed her to be jaundiced about 2 days back. Patient denies any dark stools (reports her stools to be pale for past one week and the last bowel movement was 3 days ago) .  Early this morning at around 3:45 AM she got up to go to the bathroom and family heard her morning and when they got to the bathroom she was on the floor where she had passed out. Eyes were ruled out and patient was voiding and poorly responding. She reports that she was dizzy prior to the syncopal episode and fishlike she was going to pass out. She did not remember hitting her head to any object. Sister informs that patient was poorly responsive for almost 10 minutes and EMS was called.  Patient denies fever, chills,, chest pain, palpitations, SOB, or urinary symptoms. Denies sustaining any injury from the fall.  Course in the ED  Patient was afebrile, tachycardic to 110, respiratory rate of 24 and blood pressure of 66/42. She was given IV fluids (not sure how much) high which her blood pressure improved to 117/59 mmHg. She was found to  be grossly jaundiced. Blood work done showed WBC of 13.1, hemoglobin of 8 and hematocrit of 24.5. Her hemoglobin was 12.5 about 3 weeks back platelets was 308. Stool for occult blood checked in the ED was positive. Chemistry showed sodium of 140, potassium 3.8, chloride of 105, bicarbonate 17, BUN of 25 and creatinine of 1.16.calcium of 7.5. Patient had an anion gap of 18. Lactic acid was elevated to  2.77.  LFTs were deranged with total bilirubin of 6.1, AST of 112, ALT of 172 and alkaline phosphatase of 246. She had normal LFTs 3 weeks back. INR was normal. LDH and fibrinogen were elevated. Ammonia level is normal. UA was suggestive of UTI.  [facility] called for admission to step unit for sepsis.     Hospital Course:  Principal Problem:  Sepsis  Patient admitted  to stepdown  patient hypotensive and tachycardic  due to underling GI bleed and dehydration  Given IV fluid in ED within improvement in BP. Continued on  IV NS @125  cc/ hr.  Blood cx and urine cx sent from ED. UA suggestive of UTI.  Elevated lactic acid to 2.77  Place on empiric zosyn and flagyl given concern for cholecystitis and intraabdominal infection.  CT scan of the abdomen and pelvis done which shows contained ruptured duodenal ulcer with compression of the CBD causing bile duct obstruction also there are multiple hepatic abscesses. (Please review CT scan results below) also be an gastroduodenal artery courses through the center of the contained duodenal perforation which explains the patient's Hemoccult positive stools and anemia. Patient seen by GI consult Dr. Deatra Ina and given the nature of the ulcer recommends that EGD would be risky to blow open the perforation  and given the course of gastroduodenal artery through the perforated area a percutaneous drainage and again has a hiatus for bleeding. Recommend sending her up to [place] where she had EGD and  gastric surgery.   -spoke with Dr Rutherford Guys, surgeon at [facility] and was accepted for admission to SICU. Gave sign out and update to Dr Gershon Crane and Dr Freddi Che , MD at SICU. Will send discharge summary including  disc of CT abdomen with the patient.   Active Problems:  Acute Anemia  noted for drop in 4.5 gm hb in 3 weeks. (12.5<<8).  Secondary to duodenal ulcer perforation.. patient had coffee ground emesis in ED. Patient has hx of gastric ulcer with  for  her esophageal ca about 4 yeas back. -She continues to smoke 1PPD and also reports taking 3-4 motrin daily for abdominal pain.  Stool for occult blood positive . Given one unit PRBC on admission this morning. Pt is is NPO and on PPI drip. Monitor H&H q 6hrs  getting 1 U PRBC in ED.   Obstructive jaundice  Secondary to compression from perforated duodenal ulcer.  Monitor LFTs closely.  - empiric zosyn and flagyl. Check CT and and pelvis with contrast. Also has mild elevation of lipase which could be from dehydration.    Syncope and collapse  -due to dehydration and orthostasis. SBP in 60s upon arrival. Head CT unremarkable Stable on tele.   Hypotension  secondary to dehydration and abscess. Monitor with fluids   UTI (urinary tract infection)  On empiric zosyn. Follow cx   Acute kidney injury  prerenal. Monitor with fluids.   Metabolic acidosis  Secondary to dehydration and vomiting. Monitor with fluids   History of esophageal cancer  S/p surgery with esophagogastrectomy followed by chemo and radiation  4 years back at [facility]. Now in remission and follows with oncology every 6 months.   COPD (chronic obstructive pulmonary disease)  Continue home inhaler. Counseled on smoking cessation. o2 via [place] .   GERD (gastroesophageal reflux disease)  On PPI at home. Heavy smoker with hx of gastric ulcer. EGD 4 years back. Placed on PPI drip Tobacco abuse  counseled on cessation. Placed on nicotine patch   hypothyroidism  resume synthroid once taking po  Diet:NPO   DVT prophylaxis: SCD   Code Status: full code   Family Communication: discussed with sister   Disposition Plan: transfer to [facility]    Consultations:  Dr Deatra Ina ( GI)  Discharge Exam: Filed Vitals:   06/03/13 1100  BP: 101/52  Pulse: 99  Temp:   Resp: 8   Constitutional: Vital signs reviewed. Patient is thin built elderly female lying in bed appears pale and jaundiced  HEENT: Pallor present , icteric, dry oral  mucosa, no cervical lymphadenopathy  Cardiovascular: RRR, S1 normal, S2 normal, no MRG  Chest: CTAB, no wheezes, rales, or rhonchi  Abdominal: Soft., non-distended, bowel sounds are normal, tender to palpation over epigastric and right upper quadrant area, no masses, organomegaly appreciated Ext: warm, no edema, skin grossly icteric  Neurological: A&O x3, non focal       Medication List    STOP taking these medications       aspirin 81 MG tablet     cyclobenzaprine 10 MG tablet  Commonly known as:  FLEXERIL     esomeprazole 40 MG capsule  Commonly known as:  NEXIUM     ibuprofen 200 MG tablet  Commonly known as:  ADVIL,MOTRIN     lactulose 10 GM/15ML solution  Commonly known as:  CHRONULAC     lidocaine 5 %  Commonly known as:  LIDODERM     ondansetron 8 MG disintegrating tablet  Commonly known as:  ZOFRAN-ODT     oxyCODONE-acetaminophen 5-325 MG per tablet  Commonly known as:  PERCOCET/ROXICET     polyethylene glycol packet  Commonly known as:  MIRALAX / GLYCOLAX     promethazine 25 MG suppository  Commonly known as:  PHENERGAN     traMADol 50 MG tablet  Commonly known as:  ULTRAM      Current inpatient medications       fluticasone 50 MCG/ACT nasal spray  Commonly known as:  FLONASE  Place 2 sprays into both nostrils daily.     HYDROmorphone 1 MG/ML Soln injection  Commonly known as:  DILAUDID  Inject 2 mLs (2 mg total) into the vein every 4 (four) hours as needed for severe pain.     levothyroxine 100 MCG tablet  Commonly known as:  SYNTHROID, LEVOTHROID  Take 100 mcg by mouth daily before breakfast.     nicotine 21 mg/24hr patch  Commonly known as:  NICODERM CQ - dosed in mg/24 hours  Place 1 patch (21 mg total) onto the skin daily.     pantoprazole 80 mg in sodium chloride 0.9 % 250 mL  Inject 8 mg/hr into the vein continuous.     piperacillin-tazobactam 3.375 GM/50ML IVPB  Commonly known as:  ZOSYN  Inject 50 mLs (3.375 g total) into the  vein every 8 (eight) hours.     pravastatin 40 MG tablet  Commonly known as:  PRAVACHOL  Take 40 mg by mouth daily.     venlafaxine XR 75 MG 24 hr capsule  Commonly known as:  EFFEXOR-XR  Take  75 mg by mouth daily with breakfast.     VITAMIN D (CHOLECALCIFEROL) PO  Take 1 tablet by mouth daily.         IV flagyl 500 mg 1 dose every 8 hours     Allergies  Allergen Reactions  . Amitriptyline Other (See Comments)    Memory loss   . Codeine Other (See Comments)    Nausea and diarrhea   . Eggs Or Egg-Derived Products Nausea And Vomiting  . Metoprolol Other (See Comments)    fatigue and head ache   . Sulfa Antibiotics Other (See Comments)    Rash   . Verapamil Other (See Comments)    dizziness      The results of significant diagnostics from this hospitalization (including imaging, microbiology, ancillary and laboratory) are listed below for reference.    Significant Diagnostic Studies: Dg Chest 2 View  05/14/2013   CLINICAL DATA:  Right upper and right lower quadrant abdominal pain and vomiting for 1 month, history COPD, esophageal cancer  EXAM: CHEST  2 VIEW  COMPARISON:  Chest radiograph 05/08/2013  FINDINGS: Normal heart size, mediastinal contours, and pulmonary vascularity.  Surgical clips at mediastinum.  Atherosclerotic calcification aorta.  Emphysematous changes without infiltrate, pleural effusion or pneumothorax.  Bones demineralized.  IMPRESSION: COPD changes.  No acute abnormalities.   Electronically Signed   By: Lavonia Dana M.D.   On: 05/14/2013 17:01   Ct Head Wo Contrast  06/03/2013   CLINICAL DATA:  Anemia, jaundice, history of esophageal cancer  EXAM: CT HEAD WITHOUT CONTRAST  TECHNIQUE: Contiguous axial images were obtained from the base of the skull through the vertex without intravenous contrast.  COMPARISON:  None.  FINDINGS: Gray-white differentiation is maintained. No CT evidence of acute large territory infarct. No intraparenchymal or extra-axial mass or  hemorrhage. Normal size and configuration of the ventricles and basilar cisterns. No midline shift. Intracranial atherosclerosis. Limited visualization of the paranasal sinuses and mastoid air cells are normal. Regional soft tissues appear normal. No displaced calvarial fracture.  IMPRESSION: Negative noncontrast head CT.   Electronically Signed   By: Sandi Mariscal M.D.   On: 06/03/2013 13:32   US Abdomen Complete  05/14/2013   CLINICAL DATA:  Abdominal pain and vomiting.  EXAM: ULTRASOUND ABDOMEN COMPLETE  COMPARISON:  CT abdomen and pelvis 04/27/2013  FINDINGS: Gallbladder:  No wall thickening. Negative sonographic Murphy's sign. 5 mm echogenic focus which appeared adherent to the wall without shadowing.  Common bile duct:  Diameter: 6.5 mm  Liver:  No focal lesion identified. Within normal limits in parenchymal echogenicity.  IVC:  No abnormality visualized.  Pancreas:  Limited visualization due to overlying bowel gas.  Spleen:  Size and appearance within normal limits.  Right Kidney:  Length: 10.2 cm. Echogenicity within normal limits. No mass or hydronephrosis visualized.  Left Kidney:  Length: 9.8 cm. Suboptimal visualization due to bowel gas without hydronephrosis.  Abdominal aorta:  Atherosclerosis without evidence of aneurysm.  Other findings:  None.  IMPRESSION: Small echogenic focus in the gallbladder, which may represent sludge or non shadowing stone. No gallbladder wall thickening or other evidence of acute cholecystitis. No biliary dilatation.   Electronically Signed   By: Logan Bores   On: 05/14/2013 21:21   Ct Abdomen Pelvis W Contrast  06/03/2013   CLINICAL DATA:  Anemia, jaundice, history of esophageal cancer post resection (several years ago), followed by chemo and radiation. Chronic upper abdominal pain radiating to the back.  EXAM: CT ABDOMEN AND  PELVIS WITH CONTRAST  TECHNIQUE: Multidetector CT imaging of the abdomen and pelvis was performed using the standard protocol following bolus  administration of intravenous contrast.  CONTRAST:  65mL OMNIPAQUE IOHEXOL 300 MG/ML  SOLN  COMPARISON:  US ABDOMEN COMPLETE dated 05/14/2013; CT ABD/PELVIS W CM dated 04/27/2013  FINDINGS: Grossly stable sequela of esophagectomy and gastric pull-through. There is an ill-defined mixed air and fluid containing structure adjacent to the duodenal bulb which measures approximately 3.2 x 3.1 cm (coronal image 40, series 5) and results in mass effect and apparent short-segment occlusion of the mid aspect of the common bile duct (coronal image 44, series 5) with reconstitution of the distal aspect of the common bile duct (coronal image 46, series 5). This finding is associated with development of moderate amount of primarily centralized intrahepatic biliary ductal dilatation. The gastroduodenal artery is noted to course through the center of this complex fluid collection (representative images 18, 19 and 20, series 2).  There has been development of a multiple hypoattenuating complex hepatic lesions, the largest of which within the medial segment of the left lobe of the liver measures approximately 6.3 x 4.8 cm (image 14, series 2) and the largest of which within the dome of the right lobe of the liver measures approximately 1.6 x 2.2 cm (image 11, series 2), worrisome for hepatic abscesses or (less likely) bilomas.  The gallbladder is noted to be distended but otherwise normal. No definite radiopaque gallstones. No gallbladder wall thickening or pericholecystic fluid. No ascites.  There is symmetric enhancement and excretion of the bilateral kidneys. No definite gallstones on this postcontrast examination. No discrete renal lesions. No urinary obstruction or perinephric stranding. Normal appearance of the bilateral adrenal glands and spleen. A surgical clip is noted within the splenic hilum. The pancreas is atrophic but otherwise normal. No definitive peripancreatic stranding.  Moderate to large colonic stool burden without  evidence of enteric obstruction. A short segment of the transverse colon is noted to be located within a wide neck midline incisional hernia, not result in enteric obstruction. No pneumoperitoneum, pneumatosis or portal venous gas.  Moderate to large amount of predominantly calcified atherosclerotic plaque within a normal caliber abdominal aorta. The major branch vessels of the abdominal aorta are heavily diseased near their origin of the warning patent on this non CT examination. Shotty porta hepatis and retroperitoneal lymph nodes are individually not enlarged by size criteria. No retroperitoneal, mesenteric, pelvic or inguinal lymphadenopathy.  The pelvic organs are normal in appearance for age. No discrete adnexal lesion. No free fluid within the pelvis.  Normal hepatic contour. There has been interval development of mild primarily centralized intrahepatic biliary duct dilatation  Limited visualization of the lower thorax demonstrates development of a trace left-sided effusion. Slight worsening of bibasilar dependent ground-glass atelectasis. No discrete focal airspace opacities. Normal heart size. No pericardial effusion.  Grossly unchanged mild to moderate compression deformities involving the superior endplates of the 624THL and T12 vertebral bodies. No definite retropulsion. Regional soft tissues appear normal.  IMPRESSION: 1. Findings worrisome for a contained ruptured duodenal ulcer measuring approximately 3.2 x 3.1 cm and results in short segment occlusion of the mid aspect of the common bile duct. This finding is associated with moderate centralized intrahepatic biliary ductal dilatation and the development of multiple new hypo attenuating complex hepatic lesions worrisome for multiple hepatic abscesses, the largest of which within the left lobe of the liver measures approximately 6.3 x 4.8 cm. Note, the GDA courses through the center of  this contained duodenal perforation, a finding which may explain the  patient's hemoccult positive stools and anemia. 2. Stable sequela of esophagectomy and gastric pull-through. 3. Unchanged mild to moderate compression deformities involving the superior endplates of the J47 and T12 vertebral bodies. Critical Value/emergent results were called by telephone at the time of interpretation on 06/03/2013 at 2:01 PM to Dr. Elyn Peers , who verbally acknowledged these results.   Electronically Signed   By: Sandi Mariscal M.D.   On: 06/03/2013 14:29   Dg Chest Port 1 View  06/03/2013   CLINICAL DATA:  Mental status changes.  EXAM: PORTABLE CHEST - 1 VIEW  COMPARISON:  05/14/2013  FINDINGS: Postoperative changes in the mediastinum. Shallow inspiration. Blunting of right costophrenic angle suggesting fluid or thickened pleura. This has been present previously. No focal airspace disease or consolidation.  IMPRESSION: Stable appearance of the chest since previous study.   Electronically Signed   By: Lucienne Capers M.D.   On: 06/03/2013 05:26   Dg Abd 2 Views  05/14/2013   CLINICAL DATA:  Abdominal pain.  Vomiting.  Esophageal carcinoma.  EXAM: ABDOMEN - 2 VIEW  COMPARISON:  05/08/2013  FINDINGS: The bowel gas pattern is normal. There is no evidence of free air. No radio-opaque calculi or other significant radiographic abnormality is seen. Surgical clips noted in the left abdomen.  IMPRESSION: No acute findings.   Electronically Signed   By: Earle Gell M.D.   On: 05/14/2013 17:07   Dg Abd Acute W/chest  05/08/2013   CLINICAL DATA:  Constipation, abdominal pain, vomiting  EXAM: ACUTE ABDOMEN SERIES (ABDOMEN 2 VIEW & CHEST 1 VIEW)  COMPARISON:  CT ABD/PELVIS W CM dated 04/27/2013; DG ABD 1 VIEW dated 04/27/2013  FINDINGS: Heart size is mildly prominent. No focal pulmonary parenchymal opacification. Mild hyperinflation may suggest emphysema. No pleural effusion. Clips overlie the mediastinum. No free air beneath the diaphragms.  Mild leftward thoracic curvature centered at T11. Normal bowel gas  pattern. Left lower quadrant clips are noted. There is a linear 6 mm calcification projecting to the left of the L1-L2 interspace on the supine projection. This overlies the left L2 transverse process on upright imaging. Costochondral calcifications are noted elsewhere. No gaseous is bowel dilatation or air-fluid level is identified.  IMPRESSION: 6 mm calcification over the left mid abdomen which could represent a ureteral calcification although a review of the dissimilar prior exam 04/27/2013 demonstrates vascular calcifications over this approximate anatomic area that could appear similar. Because no radiopaque renal or ureteral calculus was seen on the prior exam, allowing for the presence of left renal hilar vascular calcifications, this calcification is felt most likely be vascular in nature and there is no other evidence for acute intra-abdominal or pelvic pathology.  Nonobstructive bowel gas pattern.   Electronically Signed   By: Conchita Paris M.D.   On: 05/08/2013 16:43    Microbiology: No results found for this or any previous visit (from the past 240 hour(s)).   Labs: Basic Metabolic Panel:  Recent Labs Lab 06/03/13 0500 06/03/13 0502  NA 140 140  K 3.8 3.8  CL 108 105  CO2  --  17*  GLUCOSE 145* 134*  BUN 24* 25*  CREATININE 1.40* 1.16*  CALCIUM  --  7.5*   Liver Function Tests:  Recent Labs Lab 06/03/13 0502  AST 112*  ALT 172*  ALKPHOS 246*  BILITOT 6.1*  PROT 5.3*  ALBUMIN 2.1*    Recent Labs Lab 06/03/13 0502  LIPASE  187*    Recent Labs Lab 06/03/13 0527 06/03/13 0727  AMMONIA RESULTS UNAVAILABLE DUE TO INTERFERING SUBSTANCE 19   CBC:  Recent Labs Lab 06/03/13 0500 06/03/13 0502  WBC  --  13.1*  NEUTROABS  --  10.4*  HGB 9.2* 8.0*  HCT 27.0* 24.5*  MCV  --  98.8  PLT  --  308   Cardiac Enzymes: No results found for this basename: CKTOTAL, CKMB, CKMBINDEX, TROPONINI,  in the last 168 hours BNP: BNP (last 3 results) No results found for  this basename: PROBNP,  in the last 8760 hours CBG:  Recent Labs Lab 06/03/13 0507  GLUCAP 116*       Signed:  Maleiya Pergola  [facility] 06/03/2013, 3:59 PM

## 2013-06-03 NOTE — ED Notes (Signed)
Notified Floor RN that Antibiotics have not been given since blood is transfusing.

## 2013-06-03 NOTE — ED Notes (Signed)
2L NS boluses started IV.

## 2013-06-03 NOTE — ED Notes (Addendum)
Per EMS, pt coming from home for syncopal episode. Pt got up to go to the bathroom and had a syncopal episode when walking back to her room. Pt became intermittently  unresponsive in route. Pt arrousable to voice at this time. Pt is hypotensive at this time. Last BP:71/50. Pt was tachycardic on EMS arrival at 138. Pts HR is now 109. Pt received 8mg  of Zofran  And 546ml NS in route. Pt has been vomiting x 1 month.

## 2013-06-04 LAB — HEPATITIS PANEL, ACUTE
HCV AB: NEGATIVE
HEP A IGM: NONREACTIVE
HEP B S AG: NEGATIVE
Hep B C IgM: NONREACTIVE

## 2013-06-04 SURGERY — ERCP, WITH INTERVENTION IF INDICATED
Anesthesia: Monitor Anesthesia Care

## 2013-06-04 SURGERY — EGD (ESOPHAGOGASTRODUODENOSCOPY)
Anesthesia: Monitor Anesthesia Care

## 2013-06-05 LAB — TYPE AND SCREEN
ABO/RH(D): O POS
ANTIBODY SCREEN: NEGATIVE
UNIT DIVISION: 0
Unit division: 0
Unit division: 0

## 2013-06-05 LAB — MITOCHONDRIAL ANTIBODIES: Mitochondrial M2 Ab, IgG: 0.35 (ref <0.91)

## 2013-06-05 LAB — ANA: Anti Nuclear Antibody(ANA): NEGATIVE

## 2013-06-06 LAB — MRSA CULTURE

## 2013-06-08 LAB — CULTURE, BLOOD (ROUTINE X 2)

## 2013-06-09 LAB — CULTURE, BLOOD (ROUTINE X 2): Culture: NO GROWTH

## 2013-07-24 DEATH — deceased

## 2014-12-23 IMAGING — CR DG CHEST 2V
2 series · 2 of 2 positions shown · non-contrast
Comparison: Chest radiograph 05/08/2013

CLINICAL DATA: Right upper and right lower quadrant abdominal pain
and vomiting for 1 month, history COPD, esophageal cancer

EXAM:
CHEST  2 VIEW

[w chest pa]
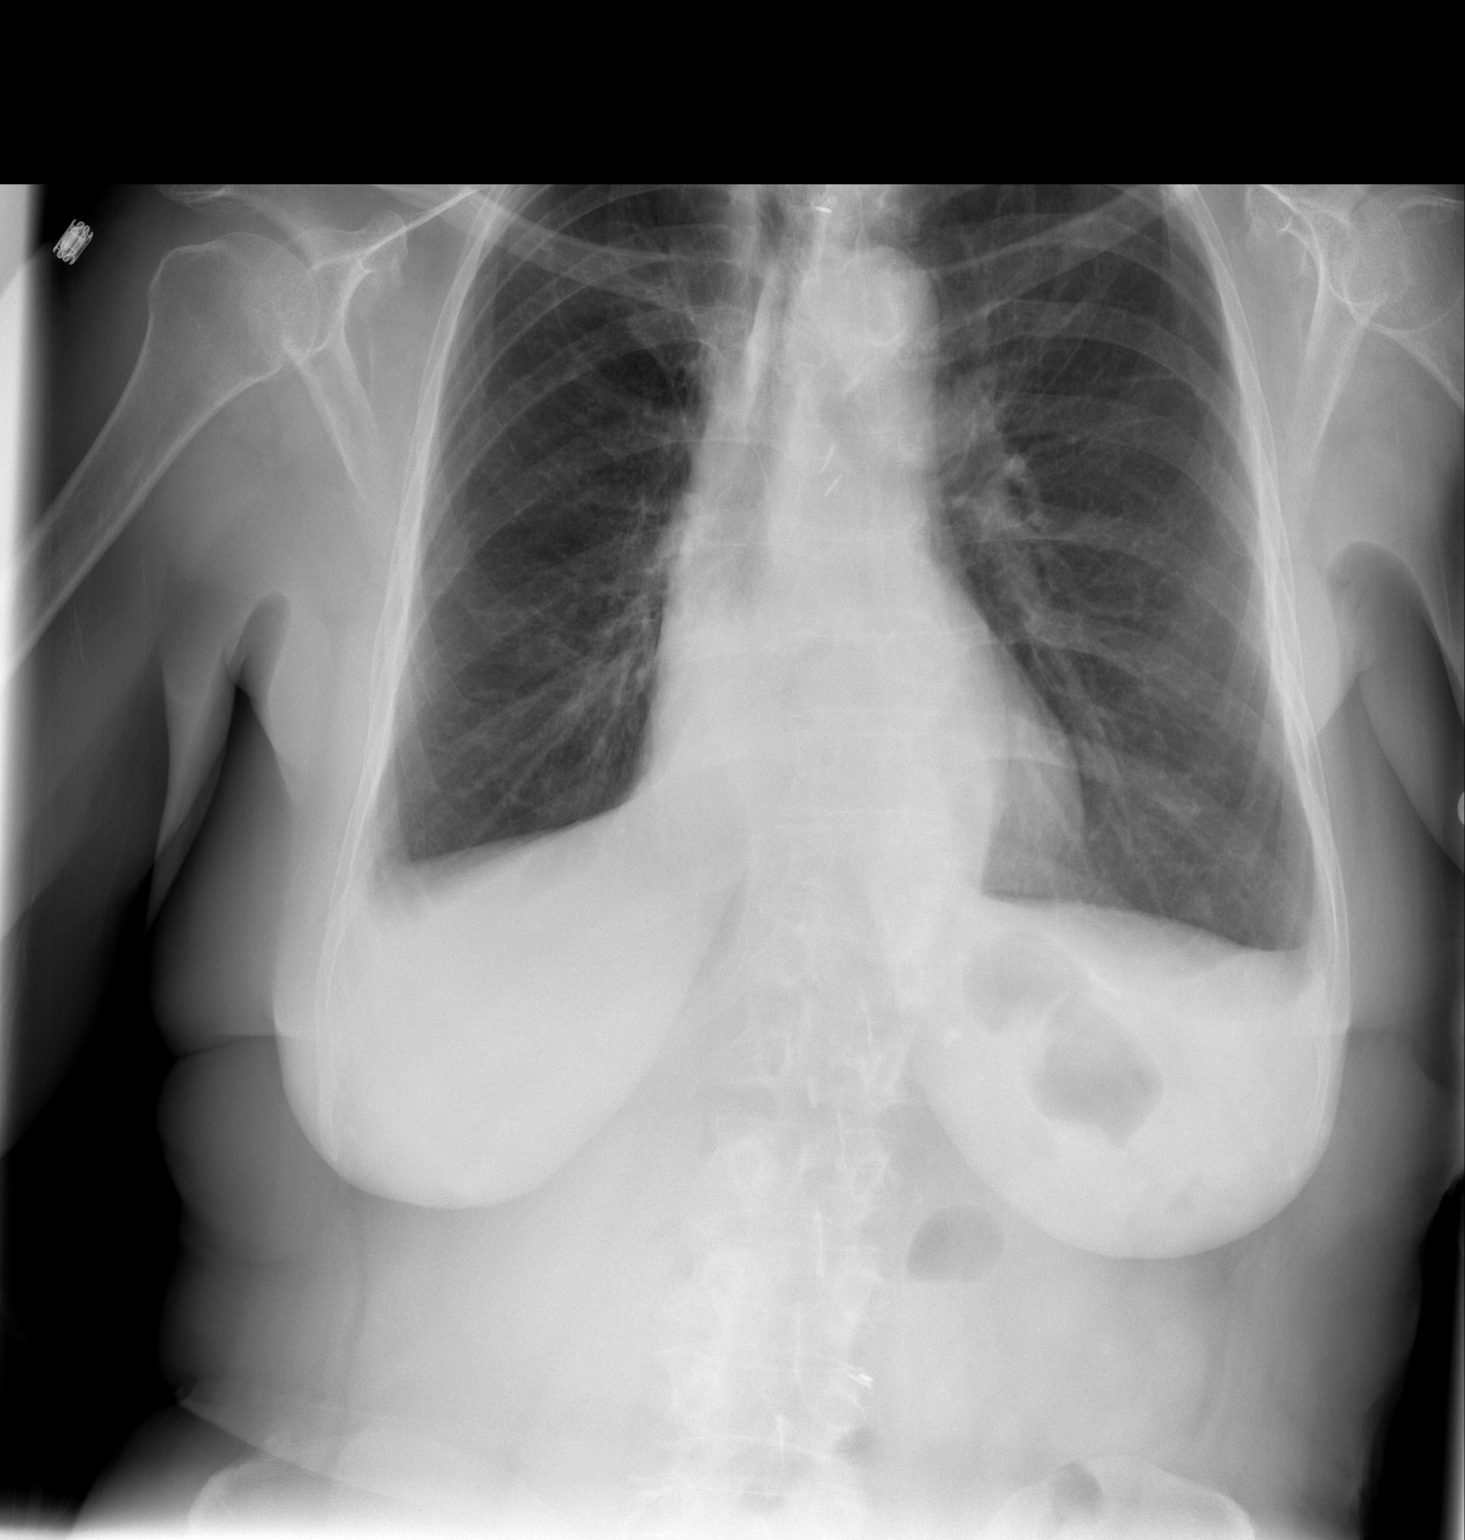

[w chest lat]
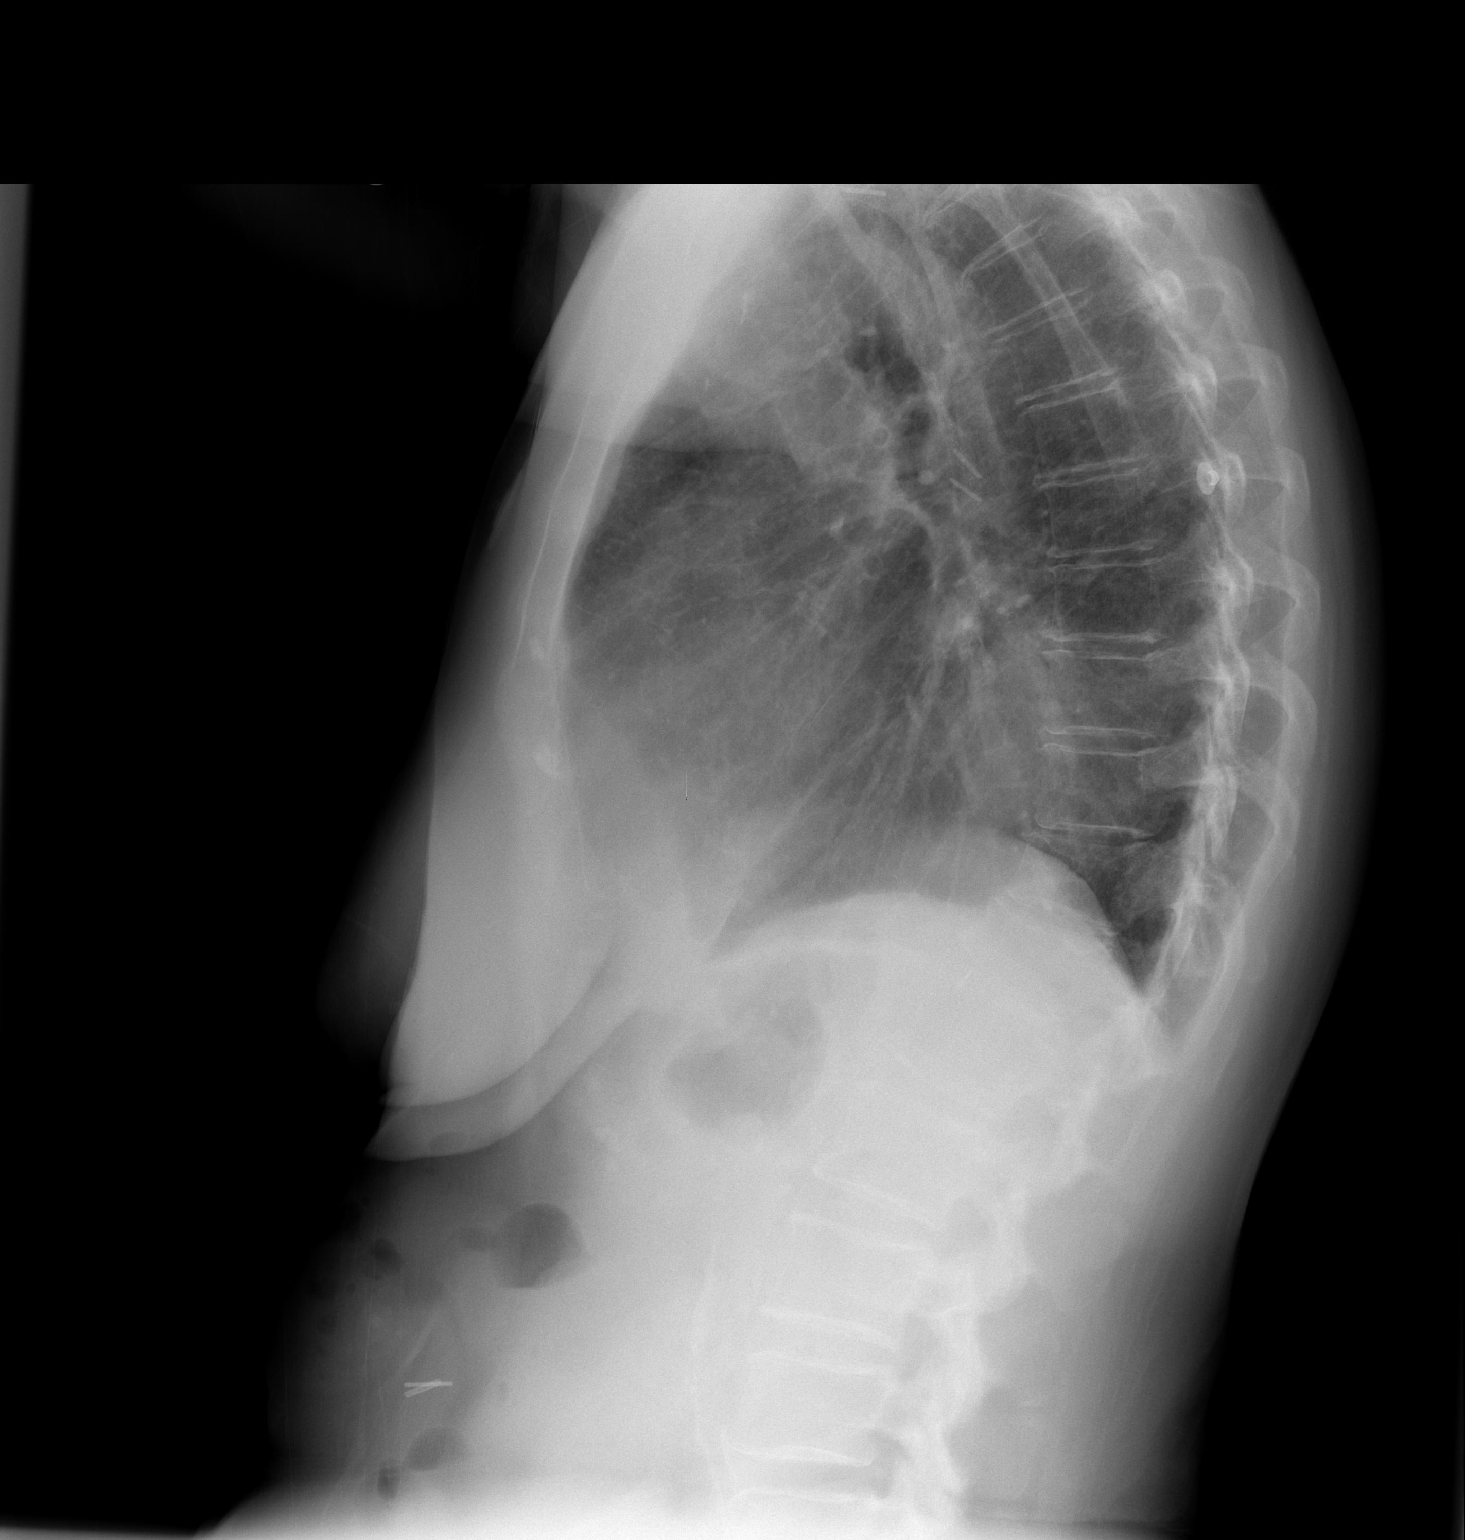

[2 of 2 positions shown; findings below may reference images not displayed]

FINDINGS: Normal heart size, mediastinal contours, and pulmonary vascularity.

Surgical clips at mediastinum.

Atherosclerotic calcification aorta.

Emphysematous changes without infiltrate, pleural effusion or
pneumothorax.

Bones demineralized.
IMPRESSION: COPD changes.

No acute abnormalities.

## 2014-12-23 IMAGING — CR DG ABDOMEN 2V
2 series · 2 of 2 positions shown · non-contrast
Comparison: 05/08/2013

CLINICAL DATA: Abdominal pain.  Vomiting.  Esophageal carcinoma.

EXAM:
ABDOMEN - 2 VIEW

[w abdomen upright]
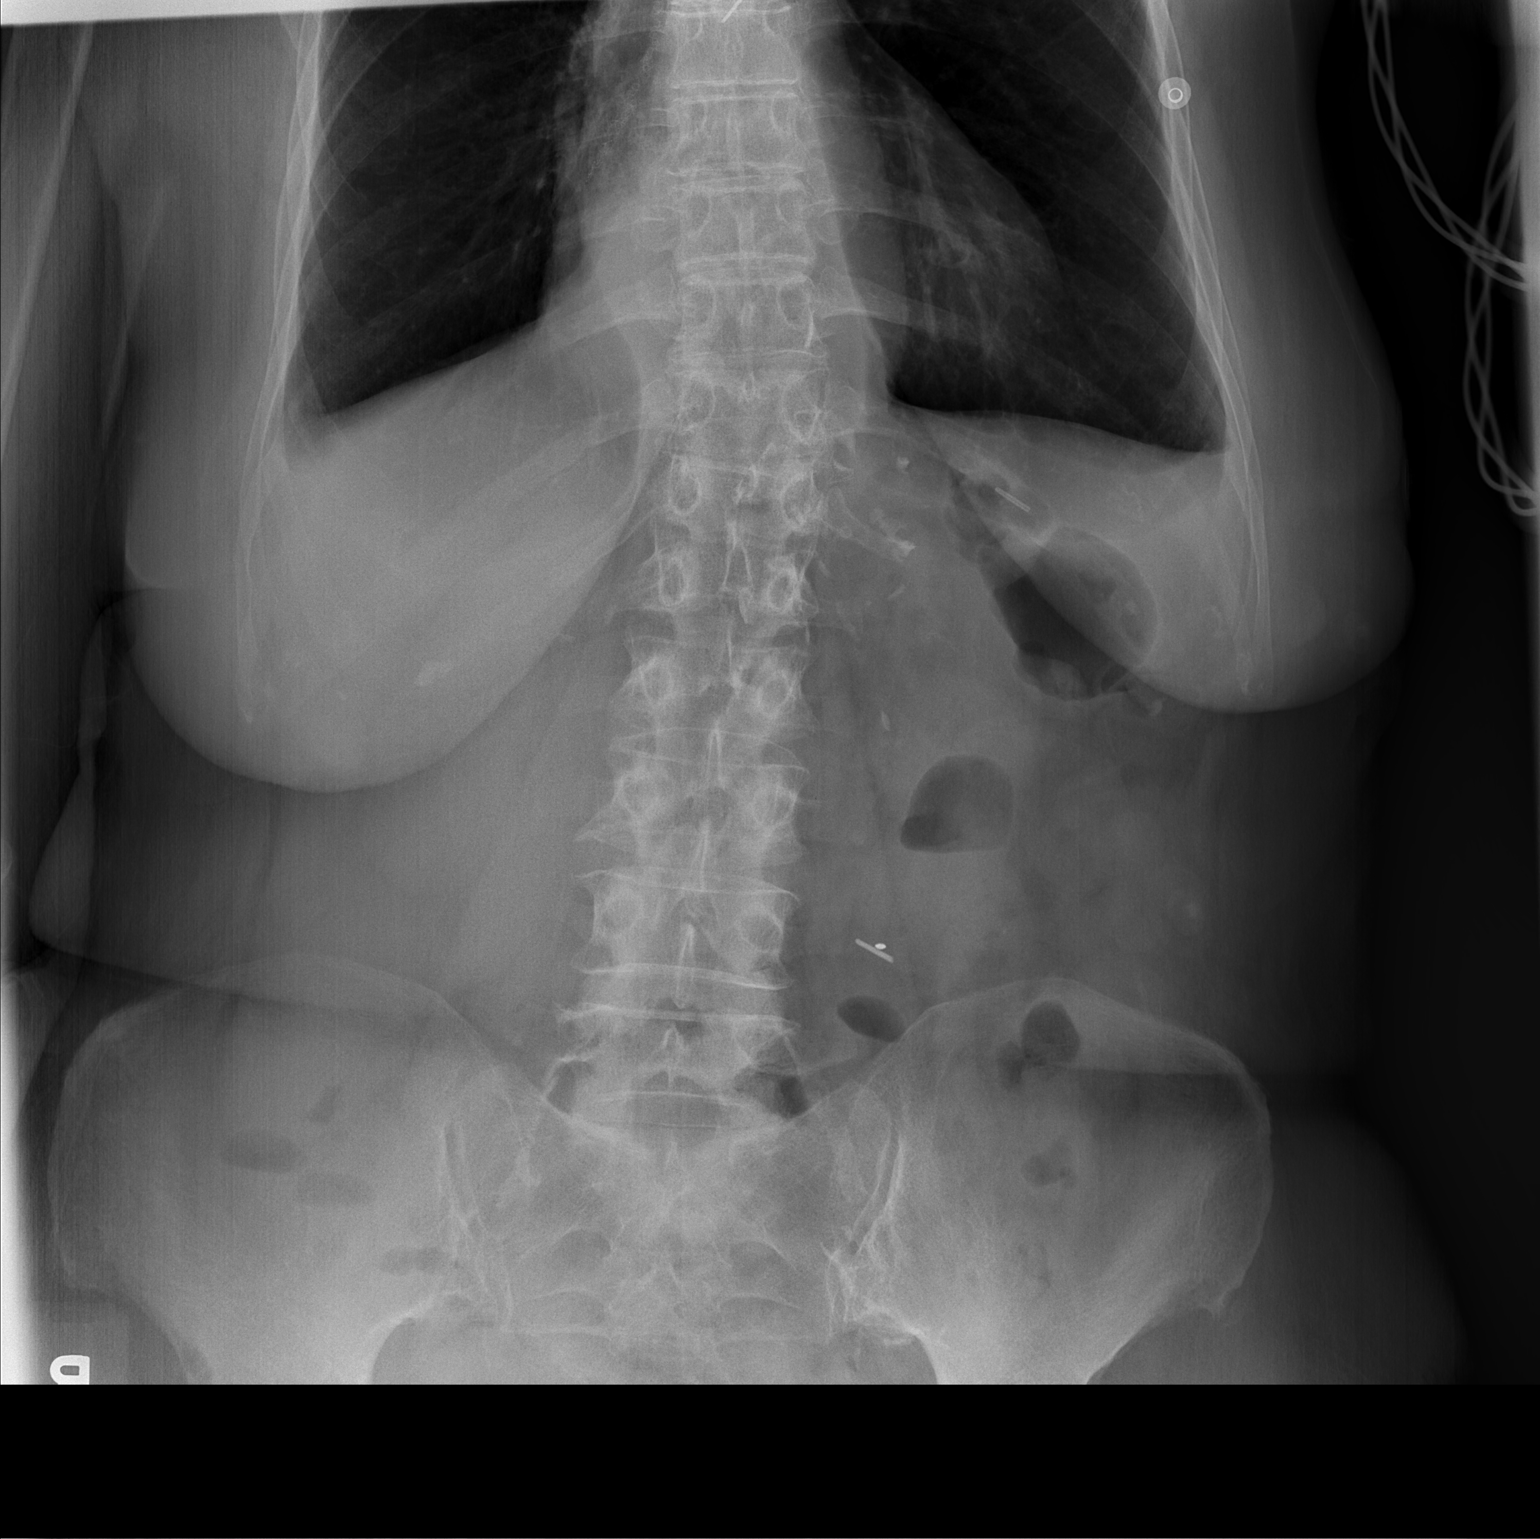

[t abdomen supine]
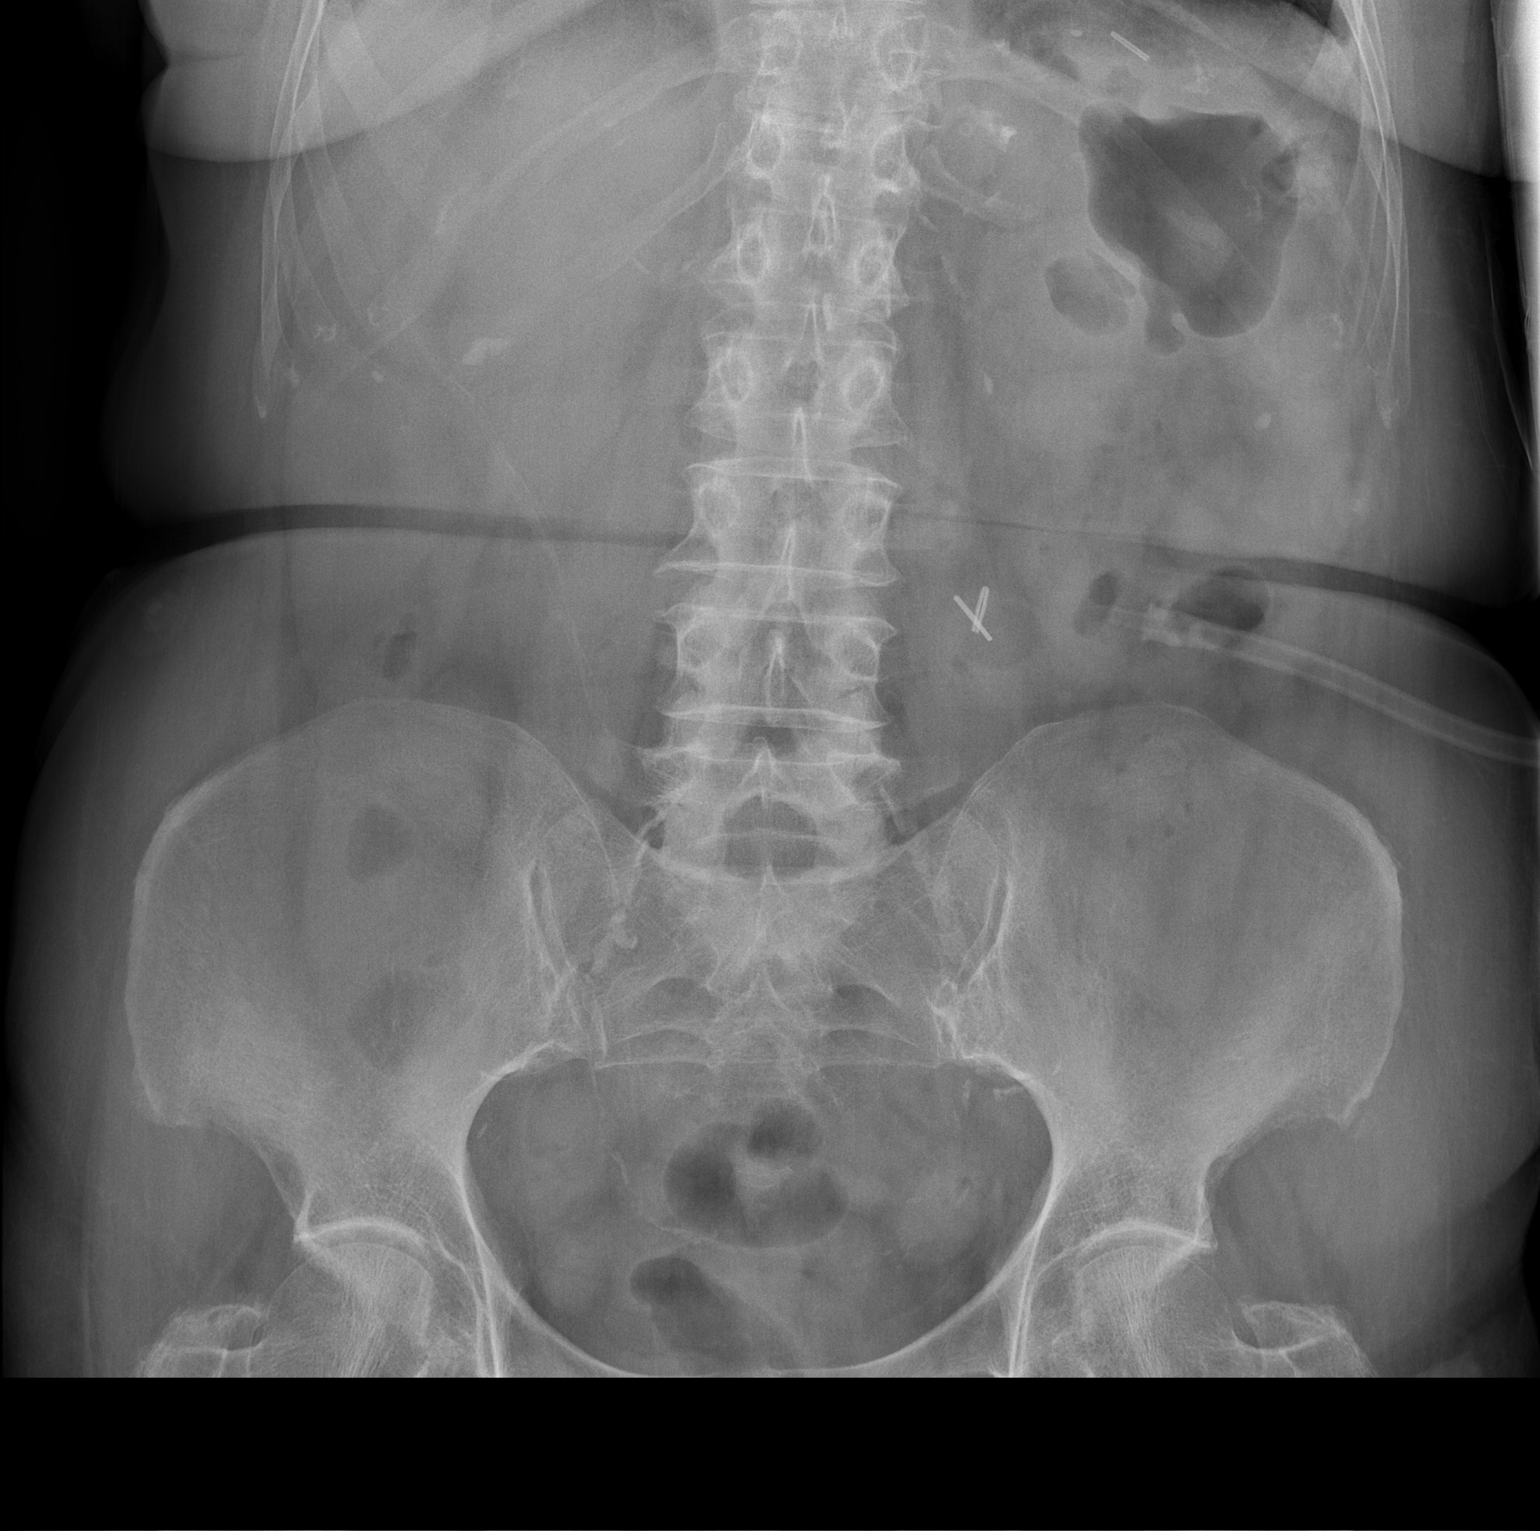

[2 of 2 positions shown; findings below may reference images not displayed]

FINDINGS: The bowel gas pattern is normal. There is no evidence of free air.
No radio-opaque calculi or other significant radiographic
abnormality is seen. Surgical clips noted in the left abdomen.
IMPRESSION: No acute findings.

## 2014-12-23 IMAGING — US US ABDOMEN COMPLETE
1 series · 14 of 25 positions shown · non-contrast
Comparison: CT abdomen and pelvis 04/27/2013

CLINICAL DATA: Abdominal pain and vomiting.

EXAM:
ULTRASOUND ABDOMEN COMPLETE

[Series 1: us abdomen complete · 0.17mm/px · 14 of 64 slices shown]
[im 1/64]
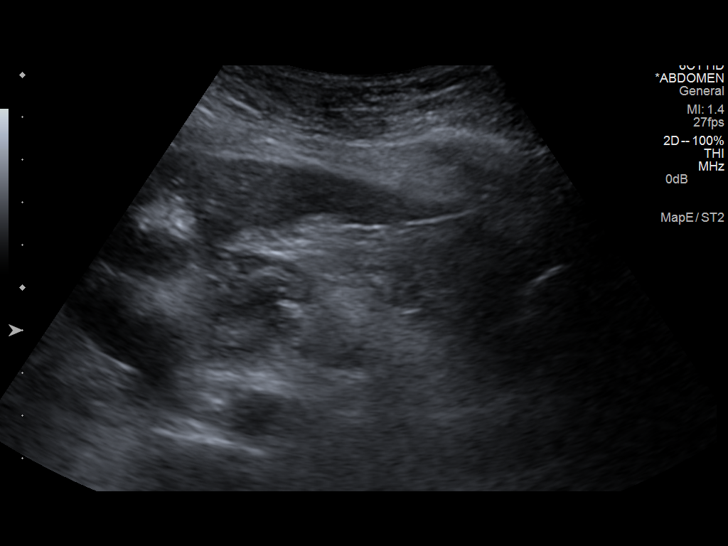
[im 6/64]
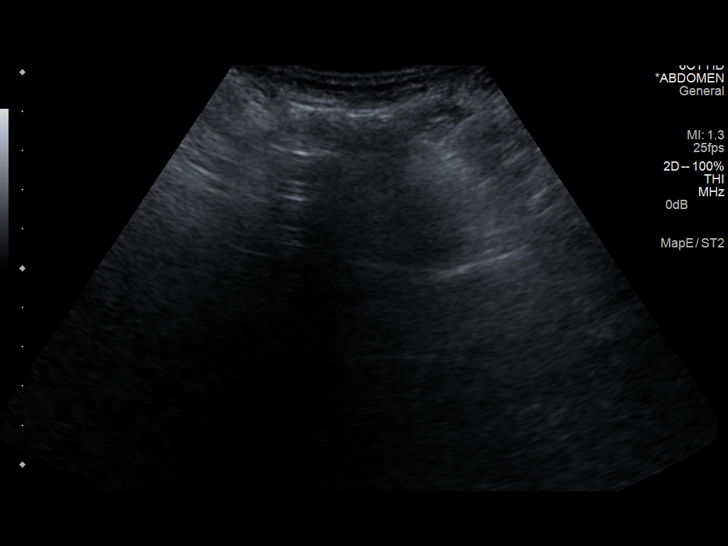
[im 11/64]
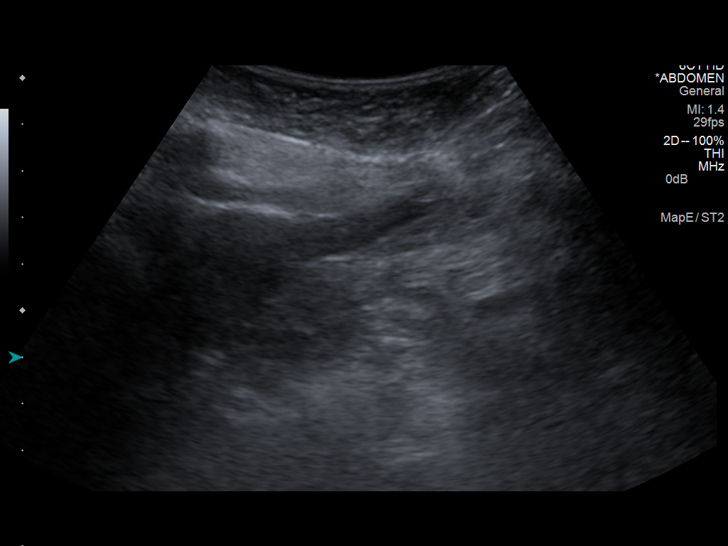
[im 16/64]
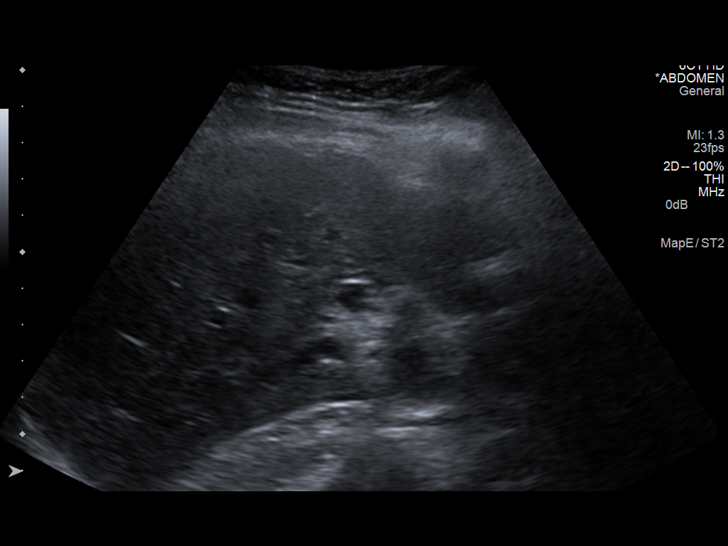
[im 22/64]
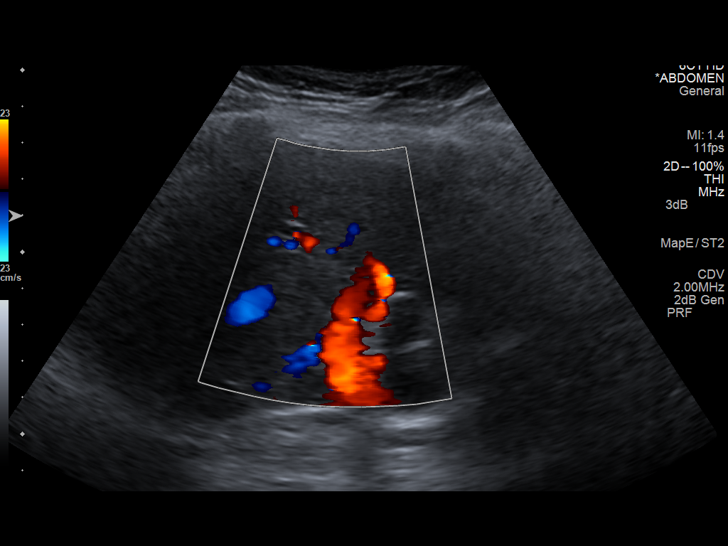
[im 24/64]
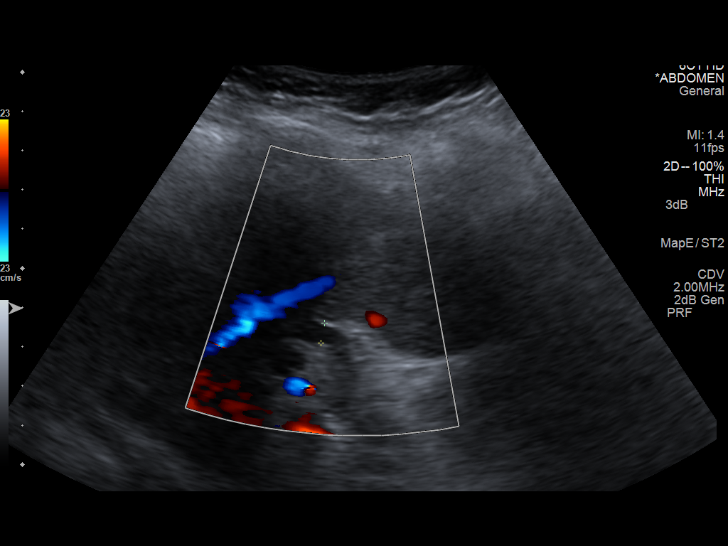
[im 29/64]
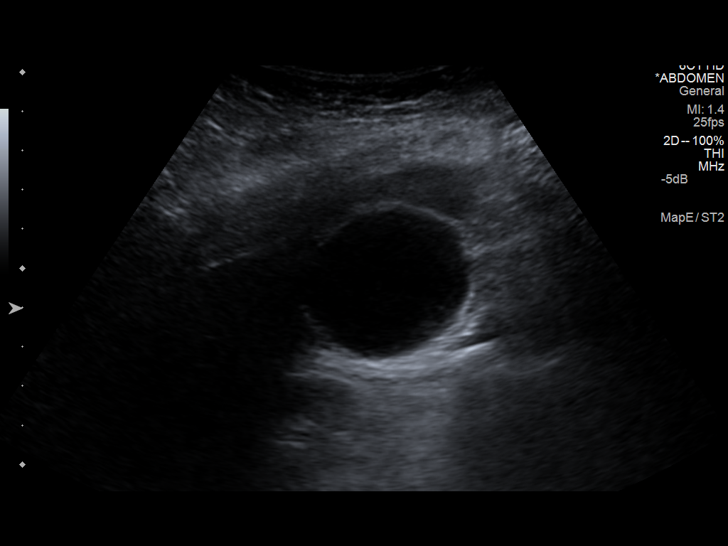
[im 35/64]
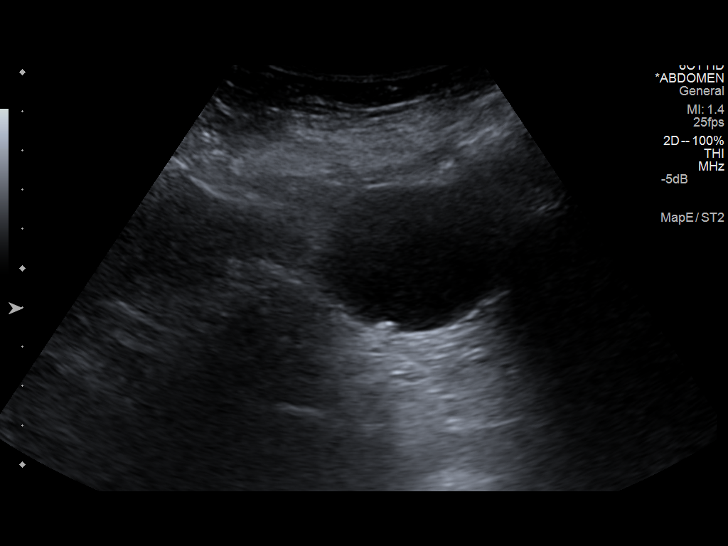
[im 40/64]
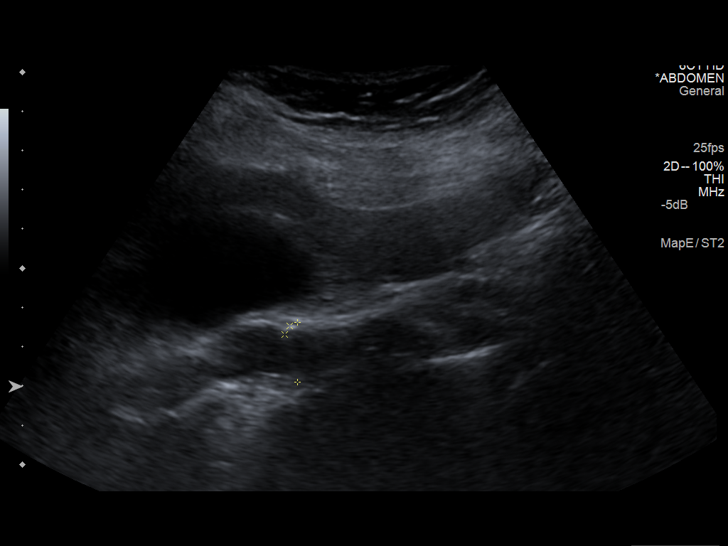
[im 43/64]
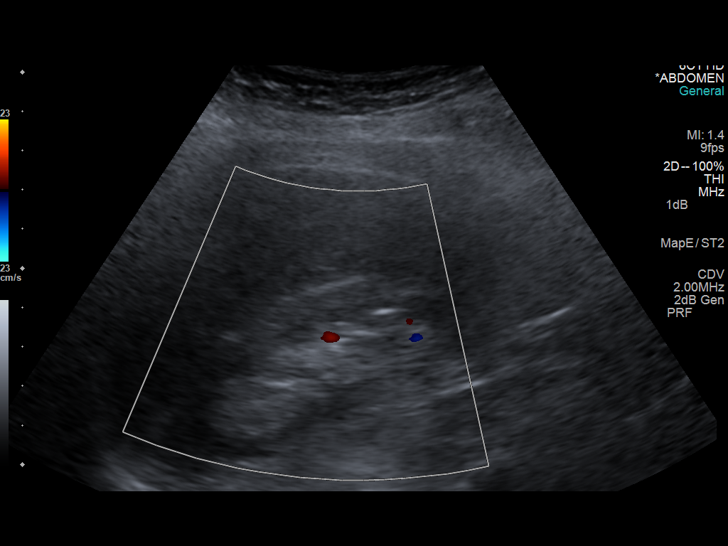
[im 48/64]
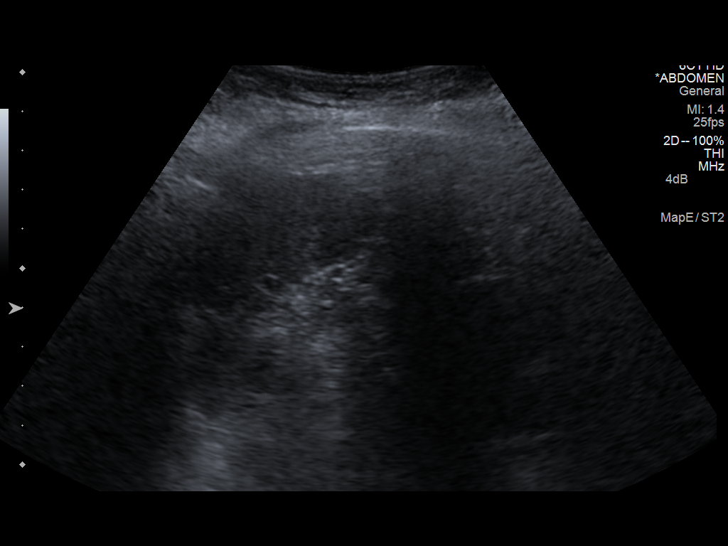
[im 53/64]
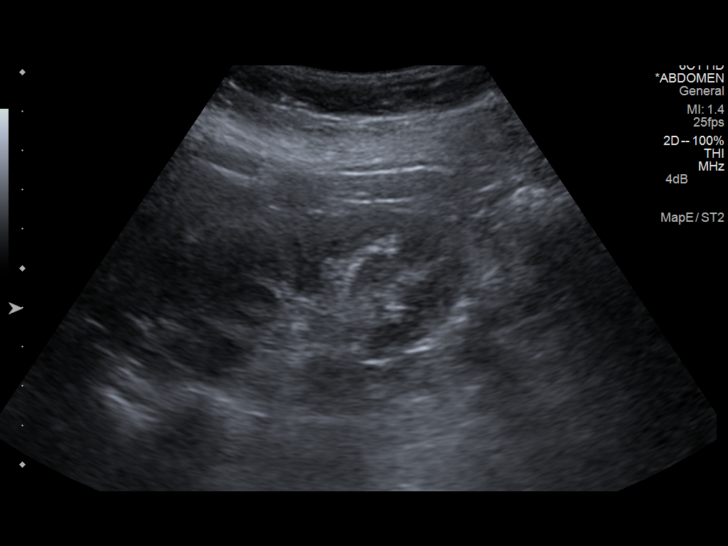
[im 58/64]
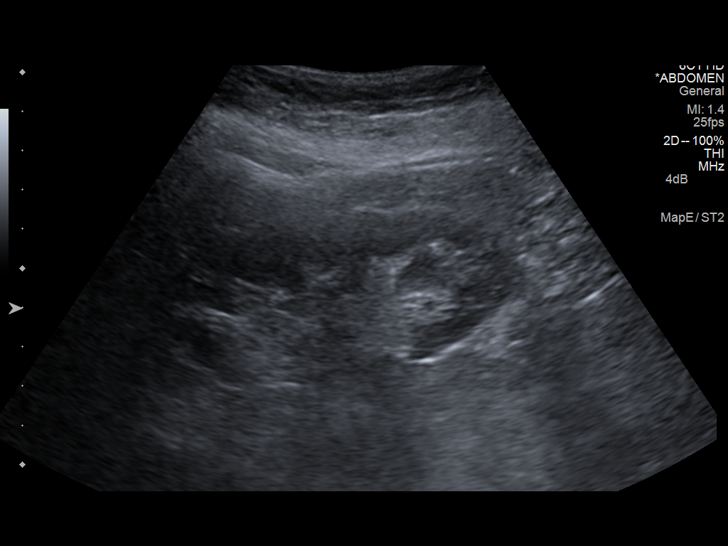
[im 64/64]
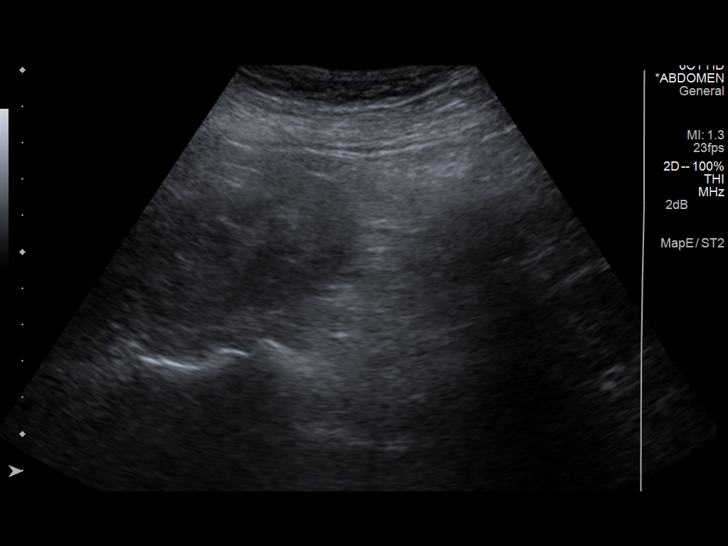

[14 of 25 positions shown; findings below may reference images not displayed]

FINDINGS: Gallbladder:

No wall thickening. Negative sonographic Murphy's sign. 5 mm
echogenic focus which appeared adherent to the wall without
shadowing.

Common bile duct:

Diameter: 6.5 mm

Liver:

No focal lesion identified. Within normal limits in parenchymal
echogenicity.

IVC:

No abnormality visualized.

Pancreas:

Limited visualization due to overlying bowel gas.

Spleen:

Size and appearance within normal limits.

Right Kidney:

Length: 10.2 cm. Echogenicity within normal limits. No mass or
hydronephrosis visualized.

Left Kidney:

Length: 9.8 cm. Suboptimal visualization due to bowel gas without
hydronephrosis.

Abdominal aorta:

Atherosclerosis without evidence of aneurysm.

Other findings:

None.
IMPRESSION: Small echogenic focus in the gallbladder, which may represent sludge
or non shadowing stone. No gallbladder wall thickening or other
evidence of acute cholecystitis. No biliary dilatation.
